# Patient Record
Sex: Male | Born: 1995 | Race: White | Hispanic: No | Marital: Married | Smoking: Current some day smoker
Health system: Southern US, Community
[De-identification: ages and names within clinical notes are randomized; demographics above are authoritative.]

## PROBLEM LIST (undated history)

## (undated) DIAGNOSIS — I10 Essential (primary) hypertension: Secondary | ICD-10-CM

## (undated) DIAGNOSIS — S060X9A Concussion with loss of consciousness of unspecified duration, initial encounter: Secondary | ICD-10-CM

## (undated) DIAGNOSIS — S060XAA Concussion with loss of consciousness status unknown, initial encounter: Secondary | ICD-10-CM

## (undated) DIAGNOSIS — F8081 Childhood onset fluency disorder: Secondary | ICD-10-CM

## (undated) HISTORY — PX: TOE SURGERY: SHX1073

## (undated) HISTORY — DX: Essential (primary) hypertension: I10

---

## 2019-05-06 ENCOUNTER — Emergency Department (HOSPITAL_COMMUNITY): Payer: BC Managed Care – PPO

## 2019-05-06 ENCOUNTER — Encounter (HOSPITAL_COMMUNITY): Payer: Self-pay

## 2019-05-06 ENCOUNTER — Other Ambulatory Visit: Payer: Self-pay

## 2019-05-06 ENCOUNTER — Emergency Department (HOSPITAL_COMMUNITY)
Admission: EM | Admit: 2019-05-06 | Discharge: 2019-05-07 | Disposition: A | Discharge status: Home / Self Care | Payer: BC Managed Care – PPO | Attending: Emergency Medicine | Admitting: Emergency Medicine

## 2019-05-06 DIAGNOSIS — F0781 Postconcussional syndrome: Secondary | ICD-10-CM | POA: Diagnosis not present

## 2019-05-06 DIAGNOSIS — Z87820 Personal history of traumatic brain injury: Secondary | ICD-10-CM | POA: Diagnosis not present

## 2019-05-06 DIAGNOSIS — G43409 Hemiplegic migraine, not intractable, without status migrainosus: Secondary | ICD-10-CM | POA: Diagnosis not present

## 2019-05-06 DIAGNOSIS — F17200 Nicotine dependence, unspecified, uncomplicated: Secondary | ICD-10-CM | POA: Diagnosis not present

## 2019-05-06 DIAGNOSIS — R4789 Other speech disturbances: Secondary | ICD-10-CM | POA: Diagnosis present

## 2019-05-06 HISTORY — DX: Concussion with loss of consciousness of unspecified duration, initial encounter: S06.0X9A

## 2019-05-06 HISTORY — DX: Childhood onset fluency disorder: F80.81

## 2019-05-06 HISTORY — DX: Concussion with loss of consciousness status unknown, initial encounter: S06.0XAA

## 2019-05-06 LAB — URINALYSIS, ROUTINE W REFLEX MICROSCOPIC
Bilirubin Urine: NEGATIVE
Glucose, UA: NEGATIVE mg/dL
Ketones, ur: NEGATIVE mg/dL
Leukocytes,Ua: NEGATIVE
Nitrite: NEGATIVE
Protein, ur: NEGATIVE mg/dL
Specific Gravity, Urine: 1.018 (ref 1.005–1.030)
pH: 5 (ref 5.0–8.0)

## 2019-05-06 LAB — COMPREHENSIVE METABOLIC PANEL
ALT: 56 U/L — ABNORMAL HIGH (ref 0–44)
AST: 34 U/L (ref 15–41)
Albumin: 4.2 g/dL (ref 3.5–5.0)
Alkaline Phosphatase: 66 U/L (ref 38–126)
Anion gap: 13 (ref 5–15)
BUN: 7 mg/dL (ref 6–20)
CO2: 21 mmol/L — ABNORMAL LOW (ref 22–32)
Calcium: 9.4 mg/dL (ref 8.9–10.3)
Chloride: 104 mmol/L (ref 98–111)
Creatinine, Ser: 1 mg/dL (ref 0.61–1.24)
GFR calc Af Amer: >60 mL/min (ref >60)
GFR calc non Af Amer: >60 mL/min (ref >60)
Glucose, Bld: 80 mg/dL (ref 70–99)
Potassium: 4 mmol/L (ref 3.5–5.1)
Sodium: 138 mmol/L (ref 135–145)
Total Bilirubin: 0.6 mg/dL (ref 0.3–1.2)
Total Protein: 7.3 g/dL (ref 6.5–8.1)

## 2019-05-06 LAB — CBC WITH DIFFERENTIAL/PLATELET
Abs Immature Granulocytes: 0.04 10*3/uL (ref 0.00–0.07)
Basophils Absolute: 0.1 10*3/uL (ref 0.0–0.1)
Basophils Relative: 1 %
Eosinophils Absolute: 0.1 10*3/uL (ref 0.0–0.5)
Eosinophils Relative: 1 %
HCT: 47.8 % (ref 39.0–52.0)
Hemoglobin: 17 g/dL (ref 13.0–17.0)
Immature Granulocytes: 0 %
Lymphocytes Relative: 27 %
Lymphs Abs: 3 10*3/uL (ref 0.7–4.0)
MCH: 32.1 pg (ref 26.0–34.0)
MCHC: 35.6 g/dL (ref 30.0–36.0)
MCV: 90.4 fL (ref 80.0–100.0)
Monocytes Absolute: 0.6 10*3/uL (ref 0.1–1.0)
Monocytes Relative: 5 %
Neutro Abs: 7.4 10*3/uL (ref 1.7–7.7)
Neutrophils Relative %: 66 %
Platelets: 250 10*3/uL (ref 150–400)
RBC: 5.29 MIL/uL (ref 4.22–5.81)
RDW: 11.9 % (ref 11.5–15.5)
WBC: 11.1 10*3/uL — ABNORMAL HIGH (ref 4.0–10.5)
nRBC: 0 % (ref 0.0–0.2)

## 2019-05-06 LAB — PROTIME-INR
INR: 1.1 (ref 0.8–1.2)
Prothrombin Time: 14.1 seconds (ref 11.4–15.2)

## 2019-05-06 LAB — APTT: aPTT: 31 seconds (ref 24–36)

## 2019-05-06 LAB — TSH: TSH: 1.969 u[IU]/mL (ref 0.350–4.500)

## 2019-05-06 MED ORDER — METOCLOPRAMIDE HCL 5 MG/ML IJ SOLN
10.0000 mg | Freq: Once | INTRAMUSCULAR | Status: AC
  Administered 2019-05-06: 10 mg via INTRAVENOUS
  Filled 2019-05-06: qty 2

## 2019-05-06 MED ORDER — SODIUM CHLORIDE 0.9 % IV BOLUS
1000.0000 mL | Freq: Once | INTRAVENOUS | Status: AC
  Administered 2019-05-06: 1000 mL via INTRAVENOUS

## 2019-05-06 MED ORDER — KETOROLAC TROMETHAMINE 15 MG/ML IJ SOLN
15.0000 mg | Freq: Once | INTRAMUSCULAR | Status: AC
  Administered 2019-05-06: 15 mg via INTRAVENOUS
  Filled 2019-05-06: qty 1

## 2019-05-06 MED ORDER — GADOBUTROL 1 MMOL/ML IV SOLN
10.0000 mL | Freq: Once | INTRAVENOUS | Status: AC | PRN
  Administered 2019-05-06: 10 mL via INTRAVENOUS

## 2019-05-06 MED ORDER — DIPHENHYDRAMINE HCL 50 MG/ML IJ SOLN
25.0000 mg | Freq: Once | INTRAMUSCULAR | Status: AC
  Administered 2019-05-06: 25 mg via INTRAVENOUS
  Filled 2019-05-06: qty 1

## 2019-05-06 NOTE — ED Triage Notes (Signed)
Pt endorses stuttering speech, tremors, left leg numbness since falling and sustaining a head injury 7 months ago. Has been told by drs that these sx are due to concussion but his father had same sx before being diagnosed with MS. Sx have come and gone intermittently. Hypertensive and has no hx.

## 2019-05-06 NOTE — ED Notes (Signed)
Patient transported to MRI 

## 2019-05-06 NOTE — ED Provider Notes (Signed)
[facility] Provider Note   CSN: [id number] Arrival date & time: 05/06/19  1431    History   Chief Complaint Chief Complaint  Patient presents with  . Tremors    HPI Luis AxonWilliam Skinner is a 23 y.o. male.     HPI  Patient is a 23 year old male with only past medical history of mild TBI (concussion) from May 2019, otherwise no known medical problems who presents to the emergency department today for evaluation of an apparent (acute) onset stuttering speech and left sided weakness since around 1030 hrs. this morning.  He is concerned, as he has had several episodes similar to this, since suffering a concussion last year. He is fearful that something more serious than a concussion could be the reason. He says that his father has multiple sclerosis and he is concerned that he could have it too. He denies any new trauma. He denies any vision changes, and reports only a mild headache at this time.     Past Medical History:  Diagnosis Date  . Concussion   . Stuttering     There are no active problems to display for this patient.   History reviewed. No pertinent surgical history.      Home Medications    Prior to Admission medications   Not on File    Family History History reviewed. No pertinent family history.  Social History Social History   Tobacco Use  . Smoking status: Current Every Day Smoker  Substance Use Topics  . Alcohol use: Yes    Comment: occ  . Drug use: Never     Allergies   Banana   Review of Systems Review of Systems  Constitutional: Negative for chills and fever.  HENT: Negative for ear pain and sore throat.   Eyes: Negative for pain and visual disturbance.  Respiratory: Negative for cough and shortness of breath.   Cardiovascular: Negative for chest pain and palpitations.  Gastrointestinal: Negative for abdominal pain and vomiting.  Genitourinary: Negative for dysuria and hematuria.   Musculoskeletal: Negative for arthralgias and back pain.  Skin: Negative for color change and rash.  Neurological: Positive for weakness (reported left arm and left leg), numbness (left arm and left leg) and headaches. Negative for seizures and syncope.  All other systems reviewed and are negative.    Physical Exam Updated Vital Signs BP 119/65 (BP Location: Left Arm)   Pulse 99   Temp 99.2 F (37.3 C)   Resp 16   SpO2 98%   Physical Exam Vitals signs and nursing note reviewed.  Constitutional:      Appearance: He is well-developed. He is obese.  HENT:     Head: Normocephalic and atraumatic.     Right Ear: External ear normal.     Left Ear: External ear normal.     Nose: Nose normal.     Mouth/Throat:     Mouth: Mucous membranes are moist.  Eyes:     Extraocular Movements: Extraocular movements intact.     Conjunctiva/sclera: Conjunctivae normal.     Pupils: Pupils are equal, round, and reactive to light.  Neck:     Musculoskeletal: Normal range of motion and neck supple. No neck rigidity or muscular tenderness.  Cardiovascular:     Rate and Rhythm: Normal rate and regular rhythm.     Pulses: Normal pulses.     Heart sounds: No murmur.  Pulmonary:     Effort: Pulmonary effort is normal. No respiratory distress.  Breath sounds: Normal breath sounds.  Abdominal:     General: Abdomen is protuberant. Bowel sounds are normal.     Palpations: Abdomen is soft. There is no shifting dullness or fluid wave.     Tenderness: There is no abdominal tenderness.  Musculoskeletal: Normal range of motion.  Lymphadenopathy:     Cervical: No cervical adenopathy.  Skin:    General: Skin is warm and dry.  Neurological:     Mental Status: He is alert and oriented to person, place, and time.     GCS: GCS eye subscore is 4. GCS verbal subscore is 5. GCS motor subscore is 6.     Cranial Nerves: Cranial nerves are intact.     Comments:  Cranial Nerves:  II: Intact to confrontation.   III, IV, VI:  Pupils equal, round and reactive to light . Full eye movements without nystagmus  V, VII: Facial sensation intact bilaterally. No weakness of masticatory muscles and smile is symmetric VIII Auditory Acuity: Grossly normal  IX/X: The uvula is midline; the palate elevates symmetrically  XI: shoulder shrug symmetric  XII: The tongue protrudes midline.    Motor System: Muscle Strength: 5/5 and symmetric in the upper and lower extremities. He does move the LUE and LLE much more slowly than the right, but ultimately has symmetric strength. No pronation or drift of BUE. Muscle Tone: Tone and muscle bulk are symmetric in the upper and lower extremities.   Reflexes: DTRs: 2+ and symmetrical in all four extremities.  Coordination:  Intact finger-to-nose and heel-to-shin. No tremor.  Sensation: Intact to light touch, though does have decreased sensation to the left upper extremity and left lower extremity as compared to the right Gait: ambulates independently without difficulty or notable abnormality      ED Treatments / Results  Labs (all labs ordered are listed, but only abnormal results are displayed) Labs Reviewed  COMPREHENSIVE METABOLIC PANEL - Abnormal; Notable for the following components:      Result Value   CO2 21 (*)    ALT 56 (*)    All other components within normal limits  CBC WITH DIFFERENTIAL/PLATELET - Abnormal; Notable for the following components:   WBC 11.1 (*)    All other components within normal limits  URINALYSIS, ROUTINE W REFLEX MICROSCOPIC - Abnormal; Notable for the following components:   Hgb urine dipstick SMALL (*)    Bacteria, UA RARE (*)    All other components within normal limits  APTT  PROTIME-INR  TSH    EKG None  Radiology Mr Laqueta JeanBrain W And Wo Contrast  Result Date: 05/06/2019 CLINICAL DATA:  Stuttering speech, tremors, and left leg numbness since sustaining head injury 7 months ago. Namely history of multiple sclerosis. EXAM: MRI  HEAD WITHOUT AND WITH CONTRAST TECHNIQUE: Multiplanar, multiecho pulse sequences of the brain and surrounding structures were obtained without and with intravenous contrast. CONTRAST:  10 mL Gadavist COMPARISON:  None. FINDINGS: Brain: No acute infarct, hemorrhage, or mass lesion is present. The ventricles are of normal size. No significant white matter lesions are present. No significant extraaxial fluid collection is present. No focal susceptibility abnormalities are present correspond with prior trauma. Dose callosum is within normal limits. The internal auditory canals are within normal limits. The brainstem and cerebellum are within normal limits. The postcontrast images demonstrate no pathologic enhancement Vascular: Flow is present in the major intracranial arteries. Skull and upper cervical spine: The craniocervical junction is normal. Upper cervical spine is within normal limits. Marrow  signal is unremarkable. Sinuses/Orbits: The paranasal sinuses and mastoid air cells are clear. The globes and orbits are within normal limits. IMPRESSION: 1. Normal MRI of the brain without and with contrast. No sequela of prior trauma or evidence for demyelinating disease. Electronically Signed   By: San Morelle M.D.   On: 05/06/2019 22:27   Mr Cervical Spine W Or Wo Contrast  Result Date: 05/06/2019 CLINICAL DATA:  Acute left-sided weakness and numbness with objective motor weakness in the left upper extremity and left lower extremity is we will sensory change. Symptoms began at 10:30 a.m. today. EXAM: MRI CERVICAL SPINE WITHOUT AND WITH CONTRAST TECHNIQUE: Multiplanar and multiecho pulse sequences of the cervical spine, to include the craniocervical junction and cervicothoracic junction, were obtained without and with intravenous contrast. CONTRAST:  10 mL Gadavist COMPARISON:  MRI brain of the same day. FINDINGS: Alignment: AP alignment is anatomic. Vertebrae: Marrow signal and vertebral body heights are  normal. Cord: Normal signal is present in the cervical and upper thoracic spinal cord to the lowest imaged level, T1-2. Posterior Fossa, vertebral arteries, paraspinal tissues: Craniocervical junction is normal. Flow is present in the vertebral arteries bilaterally. Visualized intracranial contents are normal. Disc levels: There is slight disc desiccation bulging at C5-6 without significant stenosis. No other significant disc disease or stenosis is present in the cervical spine. IMPRESSION: 1. Slight desiccation of the C5-6 cervical disc without significant disc protrusion or stenosis. 2. No acute or focal lesion to explain the patient is left-sided numbness or weakness. Electronically Signed   By: San Morelle M.D.   On: 05/06/2019 22:08    Procedures Procedures (including critical care time)  Medications Ordered in ED Medications  gadobutrol (GADAVIST) 1 MMOL/ML injection 10 mL (10 mLs Intravenous Contrast Given 05/06/19 2157)  sodium chloride 0.9 % bolus 1,000 mL (0 mLs Intravenous Stopped 05/07/19 0137)  ketorolac (TORADOL) 15 MG/ML injection 15 mg (15 mg Intravenous Given 05/06/19 2311)  metoCLOPramide (REGLAN) injection 10 mg (10 mg Intravenous Given 05/06/19 2311)  diphenhydrAMINE (BENADRYL) injection 25 mg (25 mg Intravenous Given 05/06/19 2310)     Initial Impression / Assessment and Plan / ED Course  I have reviewed the triage vital signs and the nursing notes.  Pertinent labs & imaging results that were available during my care of the patient were reviewed by me and considered in my medical decision making (see chart for details).  Of note, this patient was evaluated in the Emergency Department for the symptoms described in the history of present illness. He was evaluated in the context of the global COVID-19 pandemic, which necessitated consideration that the patient might be at risk for infection with the SARS-CoV-2 virus that causes COVID-19. Institutional protocols and  algorithms that pertain to the evaluation of patients at risk for COVID-19 are in a state of rapid change based on information released by regulatory bodies including the CDC and federal and state organizations. These policies and algorithms were followed during the patient's care in the ED.  During this patient encounter, the patient was wearing a mask, and throughout this encounter I was wearing at least a surgical mask.  I was not within 6 feet of this patient for more than 15 minutes without eye protection when they were not wearing mask.   Differentials considered: TIA, CVA, space-occupying lesion, hemiplegic migraine, cluster headache, tension headache, complex migraine with deficit  EM Physician interpretation of Labs & Imaging: . CBC with slightly increased WBC of 11.1, otherwise unremarkable . Metabolic panel  with mildly decreased CO2 of 21 . UA with small Hgb and bacteria  Medical Decision Making:  Luis Skinner is a 23 y.o. male without significant past medical history who is obese and who presents to the emergency department today for evaluation of slow to acute onset left hemiplegia.  This is not the patient's first episode similar to this, he said the last episode was about 5 months ago, and all of the symptoms ultimately resolved on their own after several days. He denies any seizure history. No recent infection. He does not currently have a PCP.  He arrived afebrile and hemodynamically stable.  Neuro examination as above, significant for left hemibody paresis.  I discussed Mr. Mimbs's case with the on-call neurologist who recommended skipping the CT head and going straight to obtaining a MRI.  If the MRIs are normal, the neurologist recommends treating with a migraine cocktail for a complex migraine and reassess.  With symptom control the patient can follow-up in an outpatient clinic with neurology.  The patient was updated and is in agreement with this plan.  Imaging and lab work  has been reviewed.  MRI of the brain with and without contrast has been reviewed and shows no evidence of acute intracranial abnormality.  MRI of the cervical spine has also been reviewed and shows some desiccation of the bony vertebral body at the region of C5-C6.  See the MRI report for further detail, but no significant canal stenosis or disc protrusion.Marland Kitchen.  He was given 1 L of IV fluids, Reglan, Benadryl, Toradol and reassessed, his stuttering of speech had completely resolved and patient was able to stand and ambulate around the emergency department without difficulty, resolution of the patient's presenting symptoms.  At this time I believe no further emergent labs or imaging are indicated and the patient to be safe for discharge to home. They are in agreement with this plan, and are comfortable with discharge to home at this time. I discussed with them concerning signs and symptoms that would necessitate return to the emergency department.  They voiced understanding of these instructions and had no further questions.  The plan for this patient was discussed with my attending physician, Dr. Eber HongBrian Miller, who voiced agreement and who oversaw evaluation and treatment of this patient.   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 Postconcussion syndrome      Disposition: Discharge  Key discharge instructions: Strict return precautions provided. Patient was encouraged to return to the ED should they experience worsening or persistence of current symptoms, or should they develop new concerning symptoms. Encouraged them to f/u with their PCP on an outpatient basis. Questions regarding the diagnosis were answered, and side effects regarding therapies were provided in writing or orally. Patient discharged in stable condition.   Quaneshia Wareing A. Mayford KnifeWilliams, MD Resident Physician, PGY-3 Emergency Medicine [facility] [employer]    Saverio DankerWilliams, Zacharie Portner A, MD  05/07/19 [id number]    Eber HongMiller, Brian, MD 05/07/19 [phone]

## 2019-05-06 NOTE — ED Provider Notes (Signed)
I saw and evaluated the patient, reviewed the resident's note and I agree with the findings and plan.  Pertinent History: This patient is a obese 23 year old male, he has a history of having a concussion back in May, he also has a history of stuttering when he speaks today.  Pertinent Exam findings: On exam the patient has symmetrical strength of all 4 extremities though he moves the left side very slowly.  He has no facial droop but does have a stuttering speech.  He has normal strength at the shoulders girdle to internal/external rotation and abduction, normal strength at the biceps triceps and grips hip flexors and extensors knee flexors and extensors and the ankle flexors and extensors.  His sensation is decreased in the left arm and leg symmetrically compared to the right side which is normal.  He has normal reflexes at the brachioradialis and patellar tendons.  He is able to perform finger-nose-finger.  At this time the patient will need to undergo MRI for further evaluation of both his head and the cervical spine though I have a feeling that this is more supratentorial.  The patient is otherwise well-appearing with no reason to have ischemic or hemorrhagic causes of his illness, this does also not fit a pattern of multiple sclerosis, Guyon Barr, myasthenia gravis or other neuromuscular disorders.  I was personally present and directly supervised the following procedures:  Medical evaluation  ED ECG REPORT  I personally interpreted this EKG   Date: 05/07/2019   Rate: 68  Rhythm: normal sinus rhythm  QRS Axis: normal  Intervals: normal  ST/T Wave abnormalities: normal  Conduction Disutrbances:none  Narrative Interpretation:   Old EKG Reviewed: none available   I personally interpreted the EKG as well as the resident and agree with the interpretation on the resident's chart.  Final diagnoses:  Postconcussion syndrome  Hemiplegic migraine without status migrainosus, not intractable       Noemi Chapel, MD 05/07/19 1348

## 2019-05-10 NOTE — Progress Notes (Signed)
NEUROLOGY CONSULTATION NOTE  Luis AxonWilliam Zacharia MRN: [id number] DOB: Jan 14, 1996  Referring provider: Eber HongBrian Miller, MD Primary care provider: No PCP  Reason for consult:  Hemiplegic migraine, history of concussion  HISTORY OF PRESENT ILLNESS: Luis Skinner is a 23 year old man who presents for episodes of weakness since concussion in May 2019.  History supplemented by ED and prior neurology notes.  He sustained a concussion on 02/20/18 when he fell while getting out of the shower, striking the back of his head.  He did not lose consciousness.  He developed severe headaches.  immediately, he also exhibited stuttering and difficulty with left leg (feels weak, paresthesias, foot turned outward).  He was seen in the ED where CT of head and neck showed no acute abnormalities.  Following this, he exhibited postconcussion symptoms such as daily headaches with photophobia, daytime fatigue, difficulty concentrating and most prominently, stuttering speech.  He followed up with neurologist.  Stuttering speech reportedly improved but then worsened once he went back to work.  He then started experiencing blurred vision in his right eye and felt that his left leg was "turning outward".  No headaches.  His father has multiple sclerosis and was concerned that he too had it.  MRI of brain with and without contrast was performed on 04/29/18, which was normal.    Symptoms slowly resolved after a month.  However, his foot never completely turned straight.  Since then, he would have recurrent acute episodes of left arm and leg pain and weakness, 5-6 over past year and would last 1 to 2 days.  He presented to the ED on 05/06/19 for another episode of gradual onset of stuttering speech and left sided numbness and weakness.  He went to the ED because symptoms more severe and again associated with the stuttering.  He also had associated pain and tenderness in back of head where he hit his head, as well as dull right  periorbital ache and blurred vision.  He was evaluated by the ED provider who endorsed decreased sensation in left arm and leg and noted to move left sided "very slowly".  He was again concerned that he may have multiple sclerosis.  MRI of brain and cervical spine were performed, personally reviewed, and were normal except for slight desiccation of the C5-6 disc.  He was subsequently diagnosed with hemiplegic migraine.  Since then, stuttering improved but left arm and leg is unchanged.    He denies history of headaches or migraines. No family history of migraines.    PAST MEDICAL HISTORY: Past Medical History:  Diagnosis Date  . Concussion   . Stuttering     PAST SURGICAL HISTORY: No past surgical history on file.  MEDICATIONS: No outpatient encounter medications on file as of 05/11/2019.   No facility-administered encounter medications on file as of 05/11/2019.     ALLERGIES: Allergies  Allergen Reactions  . Banana Anaphylaxis    FAMILY HISTORY: Father:  Multiple sclerosis  SOCIAL HISTORY: Social History   Socioeconomic History  . Marital status: Married    Spouse name: Not on file  . Number of children: Not on file  . Years of education: Not on file  . Highest education level: Not on file  Occupational History  . Not on file  Social Needs  . Financial resource strain: Not on file  . Food insecurity    Worry: Not on file    Inability: Not on file  . Transportation needs    Medical: Not on  file    Non-medical: Not on file  Tobacco Use  . Smoking status: Current Every Day Smoker  Substance and Sexual Activity  . Alcohol use: Yes    Comment: occ  . Drug use: Never  . Sexual activity: Not on file  Lifestyle  . Physical activity    Days per week: Not on file    Minutes per session: Not on file  . Stress: Not on file  Relationships  . Social Musicianconnections    Talks on phone: Not on file    Gets together: Not on file    Attends religious service: Not on file     Active member of club or organization: Not on file    Attends meetings of clubs or organizations: Not on file    Relationship status: Not on file  . Intimate partner violence    Fear of current or ex partner: Not on file    Emotionally abused: Not on file    Physically abused: Not on file    Forced sexual activity: Not on file  Other Topics Concern  . Not on file  Social History Narrative  . Not on file    REVIEW OF SYSTEMS: Constitutional: No fevers, chills, or sweats, no generalized fatigue, change in appetite Eyes: No visual changes, double vision, eye pain Ear, nose and throat: No hearing loss, ear pain, nasal congestion, sore throat Cardiovascular: No chest pain, palpitations Respiratory:  No shortness of breath at rest or with exertion, wheezes GastrointestinaI: No nausea, vomiting, diarrhea, abdominal pain, fecal incontinence Genitourinary:  No dysuria, urinary retention or frequency Musculoskeletal:  No neck pain, back pain Integumentary: No rash, pruritus, skin lesions Neurological: as above Psychiatric: No depression, insomnia, anxiety Endocrine: No palpitations, fatigue, diaphoresis, mood swings, change in appetite, change in weight, increased thirst Hematologic/Lymphatic:  No purpura, petechiae. Allergic/Immunologic: no itchy/runny eyes, nasal congestion, recent allergic reactions, rashes  PHYSICAL EXAM: Blood pressure 137/83, pulse 72, temperature 98.3 F (36.8 C), temperature source Oral, height 6\' 2"  (1.88 m), weight (!) 333 lb (151 kg), SpO2 98 %. General: No acute distress.  Patient appears well-groomed.   Head:  Normocephalic/atraumatic Eyes:  fundi examined but not visualized Neck: supple, no paraspinal tenderness, full range of motion Back: No paraspinal tenderness Heart: regular rate and rhythm Lungs: Clear to auscultation bilaterally. Vascular: No carotid bruits. Neurological Exam: Mental status: alert and oriented to person, place, and time, recent and  remote memory intact, fund of knowledge intact, attention and concentration intact, speech fluent and not dysarthric, language intact. Cranial nerves: CN I: not tested CN II: pupils equal, round and reactive to light, visual fields intact CN III, IV, VI:  full range of motion, no nystagmus, no ptosis CN V: facial sensation intact CN VII: upper and lower face symmetric CN VIII: hearing intact CN IX, X: gag intact, uvula midline CN XI: sternocleidomastoid and trapezius muscles intact CN XII: tongue midline Bulk & Tone: normal, no fasciculations. Motor:  Exhibits external rotation of left foot.  Exam reveals decreased effort testing strength of left upper and lower extremities.  On finger thumb tapping, there is decreased speed and amplitude on the left by arrhythmic.  Positive Hoover's sign. Sensation:  Pinprick and vibration sensation reduced on left upper and lower extremities.. Deep Tendon Reflexes:  2+ throughout, toes downgoing.   Finger to nose testing:  Without dysmetria.   Heel to shin:  Without dysmetria.   Gait:  Ambulates with limp, left foot externally rotated.  Able to turn.  Difficulty with tandem walk.  Romberg grossly negative but has trouble placing feet together.  IMPRESSION: Left sided pain/numbness/weakness and blurred vision in right eye.  ED diagnosed hemiplegic migraine, but I strongly suspect functional etiology.  He has had chronic left sided symptoms even in between these episodes in setting of negative MRI.  Symptoms began immediately after hitting his head.  He does reports a dull headache around his right eye but nothing significant.  He has no prior history of migraines.  While people may have persistent migraine aura, his exam findings are suggestive of a functional etiology.  PLAN: 1.  I would like him to be evaluated by ophthalmology given his continued blurred vision in his right eye. 2.  I recommended physical therapy but he defers at this time.  He will  contact us if he changes his mind.  Thank you for allowing me to take part in the care of this patient.  Metta Clines, DO

## 2019-05-11 ENCOUNTER — Encounter: Payer: Self-pay | Admitting: Neurology

## 2019-05-11 ENCOUNTER — Other Ambulatory Visit: Payer: Self-pay

## 2019-05-11 ENCOUNTER — Ambulatory Visit (INDEPENDENT_AMBULATORY_CARE_PROVIDER_SITE_OTHER): Payer: BC Managed Care – PPO | Admitting: Neurology

## 2019-05-11 VITALS — BP 137/83 | HR 72 | Temp 98.3°F | Ht 74.0 in | Wt 333.0 lb

## 2019-05-11 DIAGNOSIS — R531 Weakness: Secondary | ICD-10-CM

## 2019-05-11 DIAGNOSIS — H538 Other visual disturbances: Secondary | ICD-10-CM | POA: Diagnosis not present

## 2019-05-11 NOTE — Patient Instructions (Addendum)
For the blurred vision, I will refer you to an eye doctor for evaluation If you wish to pursue physical therapy, please contact me  You have indicated you will make an appointment with [facility] in [place].

## 2019-06-08 ENCOUNTER — Ambulatory Visit: Payer: BC Managed Care – PPO | Admitting: Neurology

## 2021-04-24 ENCOUNTER — Ambulatory Visit
Admission: RE | Admit: 2021-04-24 | Discharge: 2021-04-24 | Disposition: A | Discharge status: Home / Self Care | Payer: BC Managed Care – PPO | Source: Ambulatory Visit | Attending: Obstetrics and Gynecology | Admitting: Obstetrics and Gynecology

## 2021-04-24 ENCOUNTER — Other Ambulatory Visit: Payer: Self-pay | Admitting: Obstetrics and Gynecology

## 2021-04-24 ENCOUNTER — Other Ambulatory Visit: Payer: Self-pay

## 2021-04-24 DIAGNOSIS — R5381 Other malaise: Secondary | ICD-10-CM

## 2021-04-24 DIAGNOSIS — I951 Orthostatic hypotension: Secondary | ICD-10-CM

## 2021-04-24 DIAGNOSIS — R079 Chest pain, unspecified: Secondary | ICD-10-CM | POA: Diagnosis not present

## 2021-04-24 DIAGNOSIS — J019 Acute sinusitis, unspecified: Secondary | ICD-10-CM

## 2021-04-24 DIAGNOSIS — U071 COVID-19: Secondary | ICD-10-CM

## 2021-04-24 DIAGNOSIS — I10 Essential (primary) hypertension: Secondary | ICD-10-CM

## 2021-04-24 DIAGNOSIS — R030 Elevated blood-pressure reading, without diagnosis of hypertension: Secondary | ICD-10-CM | POA: Diagnosis not present

## 2021-05-01 NOTE — Progress Notes (Signed)
Date:  05/02/2021   ID:  [name], DOB 17-Nov-1995, MRN [id number]  PCP:  Patient, No Pcp Per (Inactive)  Cardiologist:  Rex Kras, DO, [facility]  (established care 05/02/2021)  REASON FOR CONSULT: Hypertension  REQUESTING PHYSICIAN:  Geannie Risen, MD [place],   [id number]  Chief Complaint  Patient presents with   Hypertension   New Patient (Initial Visit)    HPI  Luis Skinner is a 26 y.o. male who presents to the office with a chief complaint of " hypertension." Patient's past medical history and cardiovascular risk factors include: Hypertension, seasonal allergies, obesity due to excess calories, history of COVID-19 infection.  He is referred to the office at the request of Geannie Risen, MD at [facility] for evaluation of hypertension.  Last week at work patient states that he started experiencing generalized weakness, cold, unable to focus, lightheaded and dizzy.  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  Patient was started on antihypertensive medications given his high blood pressure and EKG was performed which was interpreted to be abnormal and is now referred to cardiology for further evaluation and management.  Clinically, at times has chest pain, located substernally, more pronounced with laying down, not brought on by effort related activities, does not resolve with rest, self-limited, a dull-like sensation, lasting for few minutes, last occurrence was late last week.  The pain is nonpleuritic and nonpositional.  And he does not experience any discomfort between the shoulder blades.  Patient states that he has not noticed any change with overall functional capacity.  No family history of premature coronary disease or sudden cardiac death.  FUNCTIONAL STATUS: No structured exercise program  or daily routine.  ALLERGIES: Allergies  Allergen Reactions   Banana Anaphylaxis    MEDICATION LIST PRIOR TO VISIT: Current Meds  Medication Sig   hydrochlorothiazide (MICROZIDE) 12.5 MG capsule Take 12.5 mg by mouth daily.   levocetirizine (XYZAL) 5 MG tablet Take 5 mg by mouth daily.   lisinopril (ZESTRIL) 10 MG tablet Take 10 mg by mouth daily.   metoprolol tartrate (LOPRESSOR) 25 MG tablet Take 1 tablet (25 mg total) by mouth 2 (two) times daily.   montelukast (SINGULAIR) 10 MG tablet Take 10 mg by mouth daily.     PAST MEDICAL HISTORY: Past Medical History:  Diagnosis Date   Concussion    Hypertension    Stuttering     PAST SURGICAL HISTORY: Past Surgical History:  Procedure Laterality Date   TOE SURGERY Right     FAMILY HISTORY: The patient family history includes Depression in his sister; Hypertension in his mother; Multiple sclerosis in his father.  SOCIAL HISTORY:  The patient  reports that he has been smoking cigarettes. He has been smoking an average of .25 packs per day. He has never used smokeless tobacco. He reports current alcohol use of about 2.0 standard drinks of alcohol per week. He reports that he does not use drugs.  REVIEW OF SYSTEMS: Review of Systems  Constitutional: Negative for chills and fever.  HENT:  Negative for hoarse voice and nosebleeds.   Eyes:  Negative for discharge, double vision and pain.  Cardiovascular:  Positive for chest pain. Negative for claudication, dyspnea on exertion, leg swelling, near-syncope, orthopnea, palpitations, paroxysmal nocturnal dyspnea and syncope.  Respiratory:  Negative for hemoptysis and shortness of breath.  Musculoskeletal:  Negative for muscle cramps and myalgias.  Gastrointestinal:  Negative for abdominal pain, constipation, diarrhea, hematemesis, hematochezia, melena, nausea and vomiting.  Neurological:  Negative for dizziness and light-headedness.   PHYSICAL EXAM: Vitals with BMI 05/02/2021  05/11/2019 05/07/2019  Height '6\' 1"'  '6\' 2"'  -  Weight 327 lbs 13 oz 333 lbs -  BMI 69.45 03.88 -  Systolic 828 003 -  Diastolic 85 83 -  Pulse 90 72 99    CONSTITUTIONAL: Obese, appears older than stated age, hemodynamically stable, no acute distress.  SKIN: Skin is warm and dry. No rash noted. No cyanosis. No pallor. No jaundice HEAD: Normocephalic and atraumatic.  EYES: No scleral icterus MOUTH/THROAT: Moist oral membranes.  NECK: No JVD present. No thyromegaly noted. No carotid bruits  LYMPHATIC: No visible cervical adenopathy.  CHEST Normal respiratory effort. No intercostal retractions  LUNGS: Clear to auscultation bilaterally.  No stridor. No wheezes. No rales.  CARDIOVASCULAR: Regular, positive S1-S2, no murmurs rubs or gallops appreciated. ABDOMINAL: Obese, soft, nontender, nondistended, positive bowel sounds in all 4 quadrants, no apparent ascites.  EXTREMITIES: No peripheral edema 2+ dorsalis pedis and posterior tibial pulses. HEMATOLOGIC: No significant bruising NEUROLOGIC: Oriented to person, place, and time. Nonfocal. Normal muscle tone.  PSYCHIATRIC: Normal mood and affect. Normal behavior. Cooperative  CARDIAC DATABASE: EKG: 05/02/2021: Normal sinus rhythm, 88 bpm, right axis deviation, nonspecific T wave abnormality.  No prior ECGs available for review.    Echocardiogram: No results found for this or any previous visit from the past 1095 days.   Stress Testing: No results found for this or any previous visit from the past 1095 days.  Heart Catheterization: None  LABORATORY DATA: CBC Latest Ref Rng & Units 05/06/2019  WBC 4.0 - 10.5 K/uL 11.1(H)  Hemoglobin 13.0 - 17.0 g/dL 17.0  Hematocrit 39.0 - 52.0 % 47.8  Platelets 150 - 400 K/uL 250    CMP Latest Ref Rng & Units 05/06/2019  Glucose 70 - 99 mg/dL 80  BUN 6 - 20 mg/dL 7  Creatinine 0.61 - 1.24 mg/dL 1.00  Sodium 135 - 145 mmol/L 138  Potassium 3.5 - 5.1 mmol/L 4.0  Chloride 98 - 111 mmol/L 104  CO2 22  - 32 mmol/L 21(L)  Calcium 8.9 - 10.3 mg/dL 9.4  Total Protein 6.5 - 8.1 g/dL 7.3  Total Bilirubin 0.3 - 1.2 mg/dL 0.6  Alkaline Phos 38 - 126 U/L 66  AST 15 - 41 U/L 34  ALT 0 - 44 U/L 56(H)    Lipid Panel  No results found for: CHOL, TRIG, HDL, CHOLHDL, VLDL, LDLCALC, LDLDIRECT, LABVLDL  No components found for: NTPROBNP No results for input(s): PROBNP in the last 8760 hours. No results for input(s): TSH in the last 8760 hours.  BMP No results for input(s): NA, K, CL, CO2, GLUCOSE, BUN, CREATININE, CALCIUM, GFRNONAA, GFRAA in the last 8760 hours.  HEMOGLOBIN A1C No results found for: HGBA1C, MPG  External Labs:  Date Collected: 04/25/2021 , information obtained by referring provider Potassium: 4.4 Creatinine 1.02 mg/dL. eGFR: 105 mL/min per 1.73 m Hemoglobin: 16.5 g/dL and hematocrit: 51.6 % AST: 39 , ALT: 64 , alkaline phosphatase: 72   IMPRESSION:    ICD-10-CM   1. Primary hypertension  I10 EKG 12-Lead    PCV ECHOCARDIOGRAM COMPLETE    2. Precordial pain  R07.2     3. Smoking  F17.200     4. Chest pain, unspecified type  R07.9 PCV ECHOCARDIOGRAM COMPLETE    CT CORONARY  MORPH W/CTA COR W/SCORE W/CA W/CM &/OR WO/CM    metoprolol tartrate (LOPRESSOR) 25 MG tablet    5. Class 3 severe obesity due to excess calories without serious comorbidity with body mass index (BMI) of 40.0 to 44.9 in adult [facility])  E66.01    Z68.41        RECOMMENDATIONS: Luis Skinner is a 25 y.o. male whose past medical history and cardiac risk factors include: Hypertension, seasonal allergies, obesity due to excess calories, history of COVID-19 infection.  Blood pressures are improving since last week.  He just started his antihypertensive medications earlier this week.  Therefore recommended being on these medications the next couple weeks and to reevaluate his blood pressures.  Have asked him to invest in a blood pressure cuff and to check his blood pressures twice a day and to bring in  the log either to myself or his provider at work to see if additional medication is warranted.  Patient is educated on the importance of a low-salt diet.  Increasing physical activity as tolerated for now.  Patient states that he is supposed to follow-up with his occupational health next week to have repeat blood work and blood pressure check.  He does not have a PCP at this time and I recommended  he have additional blood work given his metabolic syndrome and to be screened for diabetes and hyperlipidemia.  Patient states that he will request these labs to be done and will bring it in at the next office visit.  Given his precordial discomfort though appears to be noncardiac he has multiple cardiovascular risk factors such as metabolic syndrome, smoking cigarettes/vaping, EKG findings as noted above.  Recommend echocardiogram to evaluate for structural heart disease and a coronary CTA plus or minus CT FFR.  Start Lopressor 25 mg p.o. twice daily -given her upcoming coronary CTA scheduled.  FINAL MEDICATION LIST END OF ENCOUNTER: Meds ordered this encounter  Medications   metoprolol tartrate (LOPRESSOR) 25 MG tablet    Sig: Take 1 tablet (25 mg total) by mouth 2 (two) times daily.    Dispense:  60 tablet    Refill:  0     There are no discontinued medications.   Current Outpatient Medications:    hydrochlorothiazide (MICROZIDE) 12.5 MG capsule, Take 12.5 mg by mouth daily., Disp: , Rfl:    levocetirizine (XYZAL) 5 MG tablet, Take 5 mg by mouth daily., Disp: , Rfl:    lisinopril (ZESTRIL) 10 MG tablet, Take 10 mg by mouth daily., Disp: , Rfl:    metoprolol tartrate (LOPRESSOR) 25 MG tablet, Take 1 tablet (25 mg total) by mouth 2 (two) times daily., Disp: 60 tablet, Rfl: 0   montelukast (SINGULAIR) 10 MG tablet, Take 10 mg by mouth daily., Disp: , Rfl:   Orders Placed This Encounter  Procedures   CT CORONARY MORPH W/CTA COR W/SCORE W/CA W/CM &/OR WO/CM   EKG 12-Lead   PCV ECHOCARDIOGRAM  COMPLETE    There are no Patient Instructions on file for this visit.   --Continue cardiac medications as reconciled in final medication list. --Return in about 4 weeks (around 05/30/2021) for Follow up, BP, Chest pain. Or sooner if needed. --Continue follow-up with your primary care physician regarding the management of your other chronic comorbid conditions.  Patient's questions and concerns were addressed to his satisfaction. He voices understanding of the instructions provided during this encounter.   This note was created using a voice recognition software as a result there may be grammatical  errors inadvertently enclosed that do not reflect the nature of this encounter. Every attempt is made to correct such errors.  Rex Kras, [place], [facility]  Pager: [phone] Office: [phone]

## 2021-05-02 ENCOUNTER — Other Ambulatory Visit: Payer: Self-pay

## 2021-05-02 ENCOUNTER — Ambulatory Visit: Payer: BC Managed Care – PPO | Admitting: Cardiology

## 2021-05-02 ENCOUNTER — Encounter: Payer: Self-pay | Admitting: Cardiology

## 2021-05-02 VITALS — BP 132/85 | HR 90 | Temp 97.3°F | Resp 12 | Ht 73.0 in | Wt 327.8 lb

## 2021-05-02 DIAGNOSIS — F17200 Nicotine dependence, unspecified, uncomplicated: Secondary | ICD-10-CM

## 2021-05-02 DIAGNOSIS — I10 Essential (primary) hypertension: Secondary | ICD-10-CM

## 2021-05-02 DIAGNOSIS — R079 Chest pain, unspecified: Secondary | ICD-10-CM

## 2021-05-02 DIAGNOSIS — R072 Precordial pain: Secondary | ICD-10-CM

## 2021-05-02 DIAGNOSIS — F17210 Nicotine dependence, cigarettes, uncomplicated: Secondary | ICD-10-CM | POA: Diagnosis not present

## 2021-05-02 DIAGNOSIS — Z6841 Body Mass Index (BMI) 40.0 and over, adult: Secondary | ICD-10-CM

## 2021-05-02 MED ORDER — METOPROLOL TARTRATE 25 MG PO TABS: 25.0000 mg | ORAL_TABLET | Freq: Two times a day (BID) | ORAL | 0 refills | Status: DC

## 2021-05-07 ENCOUNTER — Other Ambulatory Visit: Payer: Self-pay

## 2021-05-07 ENCOUNTER — Ambulatory Visit: Payer: BC Managed Care – PPO

## 2021-05-07 DIAGNOSIS — I10 Essential (primary) hypertension: Secondary | ICD-10-CM

## 2021-05-07 DIAGNOSIS — R079 Chest pain, unspecified: Secondary | ICD-10-CM

## 2021-05-17 ENCOUNTER — Telehealth (HOSPITAL_COMMUNITY): Payer: Self-pay | Admitting: Emergency Medicine

## 2021-05-17 NOTE — Progress Notes (Signed)
Called pt to inform him about his echo results. Pt understood Pt also mention he would like his lab results.

## 2021-05-17 NOTE — Telephone Encounter (Signed)
Reaching out to patient to offer assistance regarding upcoming cardiac imaging study; pt verbalizes understanding of appt date/time, parking situation and where to check in, pre-test NPO status and medications ordered, and verified current allergies; name and call back number provided for further questions should they arise Rockwell Alexandria RN Navigator Cardiac Imaging [facility] [phone] office [phone] cell  50mg  metoprolol tartate  Denies IV issues Denies claustro

## 2021-05-20 ENCOUNTER — Other Ambulatory Visit: Payer: Self-pay

## 2021-05-20 ENCOUNTER — Ambulatory Visit (HOSPITAL_COMMUNITY)
Admission: RE | Admit: 2021-05-20 | Discharge: 2021-05-20 | Disposition: A | Discharge status: Home / Self Care | Payer: BC Managed Care – PPO | Source: Ambulatory Visit | Attending: Cardiology | Admitting: Cardiology

## 2021-05-20 DIAGNOSIS — K760 Fatty (change of) liver, not elsewhere classified: Secondary | ICD-10-CM | POA: Insufficient documentation

## 2021-05-20 DIAGNOSIS — R079 Chest pain, unspecified: Secondary | ICD-10-CM | POA: Insufficient documentation

## 2021-05-20 DIAGNOSIS — I10 Essential (primary) hypertension: Secondary | ICD-10-CM | POA: Diagnosis not present

## 2021-05-20 DIAGNOSIS — R072 Precordial pain: Secondary | ICD-10-CM | POA: Diagnosis not present

## 2021-05-20 MED ORDER — NITROGLYCERIN 0.4 MG SL SUBL
0.8000 mg | SUBLINGUAL_TABLET | Freq: Once | SUBLINGUAL | Status: AC
  Administered 2021-05-20: 0.8 mg via SUBLINGUAL

## 2021-05-20 MED ORDER — METOPROLOL TARTRATE 5 MG/5ML IV SOLN: 10.0000 mg | INTRAVENOUS | Status: DC | PRN

## 2021-05-20 MED ORDER — IOHEXOL 350 MG/ML SOLN
80.0000 mL | Freq: Once | INTRAVENOUS | Status: AC | PRN
  Administered 2021-05-20: 80 mL via INTRAVENOUS

## 2021-05-20 MED ORDER — NITROGLYCERIN 0.4 MG SL SUBL
SUBLINGUAL_TABLET | SUBLINGUAL | Status: AC
  Filled 2021-05-20: qty 2

## 2021-05-30 ENCOUNTER — Ambulatory Visit: Payer: BC Managed Care – PPO | Admitting: Cardiology

## 2021-05-30 ENCOUNTER — Other Ambulatory Visit: Payer: Self-pay

## 2021-05-30 ENCOUNTER — Encounter: Payer: Self-pay | Admitting: Cardiology

## 2021-05-30 VITALS — BP 124/77 | HR 69 | Temp 98.7°F | Ht 73.0 in | Wt 325.0 lb

## 2021-05-30 DIAGNOSIS — F17290 Nicotine dependence, other tobacco product, uncomplicated: Secondary | ICD-10-CM

## 2021-05-30 DIAGNOSIS — Z6841 Body Mass Index (BMI) 40.0 and over, adult: Secondary | ICD-10-CM | POA: Diagnosis not present

## 2021-05-30 DIAGNOSIS — I10 Essential (primary) hypertension: Secondary | ICD-10-CM | POA: Diagnosis not present

## 2021-05-30 MED ORDER — LISINOPRIL 20 MG PO TABS: 20.0000 mg | ORAL_TABLET | Freq: Every morning | ORAL | 0 refills | Status: DC

## 2021-05-30 NOTE — Progress Notes (Signed)
Date:  05/30/2021   ID:  Luis Skinner, DOB 10-28-1995, MRN [id number]  PCP:  Geannie Risen, MD  Cardiologist:  Rex Kras, DO, [facility]  (established care 05/02/2021)  Date: 05/30/21 Last Office Visit: 05/02/2021  Chief Complaint  Patient presents with   Hypertension   Chest Pain   Follow-up    HPI  Luis Skinner is a 25 y.o. male who presents to the office with a chief complaint of " follow-up for blood pressure management and reevaluation of chest pain and discuss test results." Patient's past medical history and cardiovascular risk factors include: Hypertension, seasonal allergies, obesity due to excess calories, history of COVID-19 infection.  He is referred to the office at the request of [facility] for evaluation of hypertension.  Prior to establishing care patient was diagnosed with high blood pressure and started on antihypertensive medications by his medical provider at [facility].  At last office visit he was also complaining of symptoms of chest pain and given his young age, nonspecific findings, and multiple risk factors that shared decision was to proceed with an echocardiogram and coronary CTA.  Results of the echocardiogram and coronary CTA discussed with the patient at great length and findings noted below for further reference.  Since last office visit patient has not had any reoccurrence of chest pain.  He is trying to get in at least 4000 steps on a daily basis.  He has stopped smoking cigarettes but continues to vape with decreasing nicotine cartridges.  Patient states that his home blood pressures on current medical regimen is SBP 970-263 mmHg and diastolic blood pressures ranging between 70-80 mmHg.  He has lost 2 pounds since last office visit.  FUNCTIONAL STATUS: No structured exercise program or daily routine.  ALLERGIES: Allergies  Allergen Reactions   Banana Anaphylaxis    MEDICATION LIST PRIOR TO  VISIT: Current Meds  Medication Sig   albuterol (VENTOLIN HFA) 108 (90 Base) MCG/ACT inhaler Inhale 1-2 puffs into the lungs every 4 (four) hours as needed.   hydrochlorothiazide (MICROZIDE) 12.5 MG capsule Take 12.5 mg by mouth daily.   levocetirizine (XYZAL) 5 MG tablet Take 5 mg by mouth daily.   montelukast (SINGULAIR) 10 MG tablet Take 10 mg by mouth daily.   [DISCONTINUED] lisinopril (ZESTRIL) 10 MG tablet Take 10 mg by mouth daily.   [DISCONTINUED] metoprolol tartrate (LOPRESSOR) 25 MG tablet Take 1 tablet (25 mg total) by mouth 2 (two) times daily.     PAST MEDICAL HISTORY: Past Medical History:  Diagnosis Date   Concussion    Hypertension    Stuttering     PAST SURGICAL HISTORY: Past Surgical History:  Procedure Laterality Date   TOE SURGERY Right     FAMILY HISTORY: The patient family history includes Depression in his sister; Hypertension in his mother; Multiple sclerosis in his father.  SOCIAL HISTORY:  The patient  reports that he has been smoking cigarettes. He has been smoking an average of .25 packs per day. He has never used smokeless tobacco. He reports current alcohol use of about 2.0 standard drinks per week. He reports that he does not use drugs.  REVIEW OF SYSTEMS: Review of Systems  Constitutional: Negative for chills and fever.  HENT:  Negative for hoarse voice and nosebleeds.   Eyes:  Negative for discharge, double vision and pain.  Cardiovascular:  Positive for chest pain. Negative for claudication, dyspnea on exertion, leg swelling, near-syncope, orthopnea, palpitations, paroxysmal nocturnal dyspnea and syncope.  Respiratory:  Negative for hemoptysis and shortness of breath.   Musculoskeletal:  Negative for muscle cramps and myalgias.  Gastrointestinal:  Negative for abdominal pain, constipation, diarrhea, hematemesis, hematochezia, melena, nausea and vomiting.  Neurological:  Negative for dizziness and light-headedness.   PHYSICAL EXAM: Vitals  with BMI 05/30/2021 05/20/2021 05/02/2021  Height '6\' 1"'  - '6\' 1"'   Weight 325 lbs - 327 lbs 13 oz  BMI 40.08 - 67.61  Systolic 950 932 671  Diastolic 77 82 85  Pulse 69 - 90    CONSTITUTIONAL: Obese, appears older than stated age, hemodynamically stable, no acute distress.  SKIN: Skin is warm and dry. No rash noted. No cyanosis. No pallor. No jaundice HEAD: Normocephalic and atraumatic.  EYES: No scleral icterus MOUTH/THROAT: Moist oral membranes.  NECK: No JVD present. No thyromegaly noted. No carotid bruits  LYMPHATIC: No visible cervical adenopathy.  CHEST Normal respiratory effort. No intercostal retractions  LUNGS: Clear to auscultation bilaterally.  No stridor. No wheezes. No rales.  CARDIOVASCULAR: Regular, positive S1-S2, no murmurs rubs or gallops appreciated. ABDOMINAL: Obese, soft, nontender, nondistended, positive bowel sounds in all 4 quadrants, no apparent ascites.  EXTREMITIES: No peripheral edema 2+ dorsalis pedis and posterior tibial pulses. HEMATOLOGIC: No significant bruising NEUROLOGIC: Oriented to person, place, and time. Nonfocal. Normal muscle tone.  PSYCHIATRIC: Normal mood and affect. Normal behavior. Cooperative  CARDIAC DATABASE: EKG: 05/02/2021: Normal sinus rhythm, 88 bpm, right axis deviation, nonspecific T wave abnormality.  No prior ECGs available for review.    Echocardiogram: 05/07/2021: Normal LV systolic function with visual EF 50-55%. Left ventricle cavity is normal in size. Mild to moderate left ventricular hypertrophy. Normal global wall motion. Normal diastolic filling pattern, normal LAP. Mild (Grade I) mitral regurgitation. Mild tricuspid regurgitation. RVSP measures 31 mmHg. IVC is dilated with a respiratory response of <50%.   Stress Testing: No results found for this or any previous visit from the past 1095 days.  Heart Catheterization: None  CCTA:  05/20/2021: 1. Total coronary calcium score of 0. 2. Normal coronary origin with right  dominance. 3. CAD-RADS = 0 No evidence of epicardial coronary artery disease.  LABORATORY DATA: CBC Latest Ref Rng & Units 05/06/2019  WBC 4.0 - 10.5 K/uL 11.1(H)  Hemoglobin 13.0 - 17.0 g/dL 17.0  Hematocrit 39.0 - 52.0 % 47.8  Platelets 150 - 400 K/uL 250    CMP Latest Ref Rng & Units 05/06/2019  Glucose 70 - 99 mg/dL 80  BUN 6 - 20 mg/dL 7  Creatinine 0.61 - 1.24 mg/dL 1.00  Sodium 135 - 145 mmol/L 138  Potassium 3.5 - 5.1 mmol/L 4.0  Chloride 98 - 111 mmol/L 104  CO2 22 - 32 mmol/L 21(L)  Calcium 8.9 - 10.3 mg/dL 9.4  Total Protein 6.5 - 8.1 g/dL 7.3  Total Bilirubin 0.3 - 1.2 mg/dL 0.6  Alkaline Phos 38 - 126 U/L 66  AST 15 - 41 U/L 34  ALT 0 - 44 U/L 56(H)    Lipid Panel  No results found for: CHOL, TRIG, HDL, CHOLHDL, VLDL, LDLCALC, LDLDIRECT, LABVLDL  No components found for: NTPROBNP No results for input(s): PROBNP in the last 8760 hours. No results for input(s): TSH in the last 8760 hours.  BMP No results for input(s): NA, K, CL, CO2, GLUCOSE, BUN, CREATININE, CALCIUM, GFRNONAA, GFRAA in the last 8760 hours.  HEMOGLOBIN A1C No results found for: HGBA1C, MPG  External Labs:  Date Collected: 04/25/2021 , information obtained by referring provider Potassium: 4.4 Creatinine 1.02 mg/dL.  eGFR: 105 mL/min per 1.73 m Hemoglobin: 16.5 g/dL and hematocrit: 51.6 % AST: 39 , ALT: 64 , alkaline phosphatase: 72   External Labs: Collected: 05/06/2021 Sodium 135, potassium 4.6, chloride 95, bicarb 21, BUN 9, creatinine 0.91 Total cholesterol 200, triglycerides 185, HDL 27, LDL 139 Hemoglobin A1c 5.5 TSH 1.29 Magnesium 2.2 BNP 7.2  IMPRESSION:    ICD-10-CM   1. Primary hypertension  I10 lisinopril (ZESTRIL) 20 MG tablet    Basic metabolic panel    Magnesium    2. 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 Class 3 severe obesity due to excess calories without serious comorbidity with body mass index (BMI) of 40.0 to 44.9 in adult [facility])  E66.01     Z68.41        RECOMMENDATIONS: Cobin Cadavid is a 25 y.o. male whose past medical history and cardiac risk factors include: Hypertension, seasonal allergies, obesity due to excess calories, history of COVID-19 infection.  Primary hypertension Home blood pressures are very well controlled. Discontinue Lopressor Increase lisinopril to 20 mg p.o. daily.  Blood work in 1 week to evaluate kidney function and electrolytes. I have encouraged the patient to continue with lifestyle changes with regards to increasing physical activity to 30 minutes a day 5 days a week, low-salt diet, goal for 10,000 steps per day.  Am very hopeful that if he resolves the metabolic syndrome he may require minimal to no antihypertensive medications. He will need follow-up for long-term management. I will hold off on checking secondary causes of hypertension and renal duplex at this time as his blood pressures are well controlled on very minimal medical therapy.  Vaping nicotine dependence, tobacco product Educated on the importance of complete cessation of nicotine products.  Class 3 severe obesity due to excess calories without serious comorbidity with body mass index (BMI) of 40.0 to 44.9 in adult [facility]) Body mass index is 42.88 kg/m. I reviewed with the patient the importance of diet, regular physical activity/exercise, weight loss.   Patient is educated on increasing physical activity gradually as tolerated.  With the goal of moderate intensity exercise for 30 minutes a day 5 days a week.  As part of today's office visit I reviewed the results of the echo and coronary CTA.  LVEF is preserved without any significant valvular heart disease.  Coronary CTA notes a total coronary calcium score of 0 and no evidence of epicardial coronary artery disease.  FINAL MEDICATION LIST END OF ENCOUNTER: Meds ordered this encounter  Medications   lisinopril (ZESTRIL) 20 MG tablet    Sig: Take 1 tablet (20 mg total) by  mouth every morning.    Dispense:  30 tablet    Refill:  0     Medications Discontinued During This Encounter  Medication Reason   metoprolol tartrate (LOPRESSOR) 25 MG tablet Discontinued by provider   lisinopril (ZESTRIL) 10 MG tablet Reorder     Current Outpatient Medications:    albuterol (VENTOLIN HFA) 108 (90 Base) MCG/ACT inhaler, Inhale 1-2 puffs into the lungs every 4 (four) hours as needed., Disp: , Rfl:    hydrochlorothiazide (MICROZIDE) 12.5 MG capsule, Take 12.5 mg by mouth daily., Disp: , Rfl:    levocetirizine (XYZAL) 5 MG tablet, Take 5 mg by mouth daily., Disp: , Rfl:    montelukast (SINGULAIR) 10 MG tablet, Take 10 mg by mouth daily., Disp: , Rfl:    lisinopril (ZESTRIL) 20 MG tablet, Take 1 tablet (20 mg total) by mouth  every morning., Disp: 30 tablet, Rfl: 0  Orders Placed This Encounter  Procedures   Basic metabolic panel   Magnesium    There are no Patient Instructions on file for this visit.   --Continue cardiac medications as reconciled in final medication list. --Return in about 3 months (around 08/30/2021) for Follow up, BP. Or sooner if needed. --Continue follow-up with your primary care physician regarding the management of your other chronic comorbid conditions.  Patient's questions and concerns were addressed to his satisfaction. He voices understanding of the instructions provided during this encounter.   This note was created using a voice recognition software as a result there may be grammatical errors inadvertently enclosed that do not reflect the nature of this encounter. Every attempt is made to correct such errors.  Rex Kras, [place], [facility]  Pager: [phone] Office: [phone]

## 2021-06-07 ENCOUNTER — Other Ambulatory Visit: Payer: Self-pay

## 2021-06-07 DIAGNOSIS — I10 Essential (primary) hypertension: Secondary | ICD-10-CM

## 2021-07-10 ENCOUNTER — Other Ambulatory Visit: Payer: Self-pay | Admitting: Cardiology

## 2021-07-10 DIAGNOSIS — I10 Essential (primary) hypertension: Secondary | ICD-10-CM

## 2021-07-15 ENCOUNTER — Other Ambulatory Visit: Payer: Self-pay

## 2021-07-15 DIAGNOSIS — I10 Essential (primary) hypertension: Secondary | ICD-10-CM

## 2021-07-15 MED ORDER — LISINOPRIL 20 MG PO TABS
ORAL_TABLET | ORAL | 1 refills | Status: DC
Start: 2021-07-15 — End: 2022-04-14

## 2021-08-30 ENCOUNTER — Other Ambulatory Visit: Payer: Self-pay

## 2021-08-30 ENCOUNTER — Encounter: Payer: Self-pay | Admitting: Cardiology

## 2021-08-30 ENCOUNTER — Ambulatory Visit: Payer: BC Managed Care – PPO | Admitting: Cardiology

## 2021-08-30 VITALS — BP 130/78 | HR 88 | Resp 16 | Ht 73.0 in | Wt 322.0 lb

## 2021-08-30 DIAGNOSIS — Z6841 Body Mass Index (BMI) 40.0 and over, adult: Secondary | ICD-10-CM

## 2021-08-30 DIAGNOSIS — F17290 Nicotine dependence, other tobacco product, uncomplicated: Secondary | ICD-10-CM

## 2021-08-30 DIAGNOSIS — I10 Essential (primary) hypertension: Secondary | ICD-10-CM

## 2021-08-30 NOTE — Progress Notes (Signed)
Date:  08/30/2021   ID:  Luis Skinner, DOB May 05, 1996, MRN [id number]  PCP:  Geannie Risen, MD  Cardiologist:  Luis Kras, DO, [facility]  (established care 05/02/2021)  Date: 08/30/21 Last Office Visit: 05/30/2021  Chief Complaint  Patient presents with   Hypertension   Follow-up    HPI  Luis Skinner is a 25 y.o. male who presents to the office with a chief complaint of " follow-up for blood pressure management." Patient's past medical history and cardiovascular risk factors include: Hypertension, seasonal allergies, obesity due to excess calories, history of COVID-19 infection.  He is referred to the office at the request of [facility] for evaluation of hypertension.  Referred to the office for evaluation and management of benign essential hypertension.  His medications have been uptitrated and his home blood pressures are now consistently less than 737 mmHg and diastolic blood pressures are less than 82 mmHg.  He is implementing lifestyle changes slowly and has lost approximately 3 pounds over the course of 3 months.  He states that he is been walking 2 miles per day.  During prior office visit he is complained of chest discomfort for which she has undergone ischemic evaluation as outlined below.  Since last office visit he denies any chest pain or shortness of breath at rest or with effort related activities.  His overall functional status and physical endurance remains relatively stable.  FUNCTIONAL STATUS: No structured exercise program or daily routine.  ALLERGIES: Allergies  Allergen Reactions   Banana Anaphylaxis    MEDICATION LIST PRIOR TO VISIT: Current Meds  Medication Sig   albuterol (VENTOLIN HFA) 108 (90 Base) MCG/ACT inhaler Inhale 1-2 puffs into the lungs every 4 (four) hours as needed.   hydrochlorothiazide (MICROZIDE) 12.5 MG capsule Take 12.5 mg by mouth daily.   levocetirizine (XYZAL) 5 MG tablet Take 5 mg by mouth daily.    lisinopril (ZESTRIL) 20 MG tablet TAKE 1 TABLET BY MOUTH EVERY DAY IN THE MORNING   montelukast (SINGULAIR) 10 MG tablet Take 10 mg by mouth daily.     PAST MEDICAL HISTORY: Past Medical History:  Diagnosis Date   Concussion    Hypertension    Stuttering     PAST SURGICAL HISTORY: Past Surgical History:  Procedure Laterality Date   TOE SURGERY Right     FAMILY HISTORY: The patient family history includes Depression in his sister; Hypertension in his mother; Multiple sclerosis in his father.  SOCIAL HISTORY:  The patient  reports that he has been smoking cigarettes. He has been smoking an average of .25 packs per day. He has never used smokeless tobacco. He reports current alcohol use of about 2.0 standard drinks per week. He reports that he does not use drugs.  REVIEW OF SYSTEMS: Review of Systems  Constitutional: Negative for chills and fever.  HENT:  Negative for hoarse voice and nosebleeds.   Eyes:  Negative for discharge, double vision and pain.  Cardiovascular:  Negative for chest pain, claudication, dyspnea on exertion, leg swelling, near-syncope, orthopnea, palpitations, paroxysmal nocturnal dyspnea and syncope.  Respiratory:  Negative for hemoptysis and shortness of breath.   Musculoskeletal:  Negative for muscle cramps and myalgias.  Gastrointestinal:  Negative for abdominal pain, constipation, diarrhea, hematemesis, hematochezia, melena, nausea and vomiting.  Neurological:  Negative for dizziness and light-headedness.   PHYSICAL EXAM: Vitals with BMI 08/30/2021 05/30/2021 05/20/2021  Height 6' 1" 6' 1" -  Weight 322 lbs 325 lbs -  BMI 42.49 42.89 -  Systolic 408 144 818  Diastolic 78 77 82  Pulse 88 69 -    CONSTITUTIONAL: Obese, appears older than stated age, hemodynamically stable, no acute distress.  SKIN: Skin is warm and dry. No rash noted. No cyanosis. No pallor. No jaundice HEAD: Normocephalic and atraumatic.  EYES: No scleral icterus MOUTH/THROAT: Moist  oral membranes.  NECK: No JVD present. No thyromegaly noted. No carotid bruits  LYMPHATIC: No visible cervical adenopathy.  CHEST Normal respiratory effort. No intercostal retractions  LUNGS: Clear to auscultation bilaterally.  No stridor. No wheezes. No rales.  CARDIOVASCULAR: Regular, positive S1-S2, no murmurs rubs or gallops appreciated. ABDOMINAL: Obese, soft, nontender, nondistended, positive bowel sounds in all 4 quadrants, no apparent ascites.  EXTREMITIES: No peripheral edema 2+ dorsalis pedis and posterior tibial pulses. HEMATOLOGIC: No significant bruising NEUROLOGIC: Oriented to person, place, and time. Nonfocal. Normal muscle tone.  PSYCHIATRIC: Normal mood and affect. Normal behavior. Cooperative  CARDIAC DATABASE: EKG: 05/02/2021: Normal sinus rhythm, 88 bpm, right axis deviation, nonspecific T wave abnormality.  No prior ECGs available for review.    Echocardiogram: 05/07/2021: Normal LV systolic function with visual EF 50-55%. Left ventricle cavity is normal in size. Mild to moderate left ventricular hypertrophy. Normal global wall motion. Normal diastolic filling pattern, normal LAP. Mild (Grade I) mitral regurgitation. Mild tricuspid regurgitation. RVSP measures 31 mmHg. IVC is dilated with a respiratory response of <50%.   Stress Testing: No results found for this or any previous visit from the past 1095 days.  Heart Catheterization: None  CCTA:  05/20/2021: 1. Total coronary calcium score of 0. 2. Normal coronary origin with right dominance. 3. CAD-RADS = 0 No evidence of epicardial coronary artery disease.  LABORATORY DATA: CBC Latest Ref Rng & Units 05/06/2019  WBC 4.0 - 10.5 K/uL 11.1(H)  Hemoglobin 13.0 - 17.0 g/dL 17.0  Hematocrit 39.0 - 52.0 % 47.8  Platelets 150 - 400 K/uL 250    CMP Latest Ref Rng & Units 05/06/2019  Glucose 70 - 99 mg/dL 80  BUN 6 - 20 mg/dL 7  Creatinine 0.61 - 1.24 mg/dL 1.00  Sodium 135 - 145 mmol/L 138  Potassium 3.5 -  5.1 mmol/L 4.0  Chloride 98 - 111 mmol/L 104  CO2 22 - 32 mmol/L 21(L)  Calcium 8.9 - 10.3 mg/dL 9.4  Total Protein 6.5 - 8.1 g/dL 7.3  Total Bilirubin 0.3 - 1.2 mg/dL 0.6  Alkaline Phos 38 - 126 U/L 66  AST 15 - 41 U/L 34  ALT 0 - 44 U/L 56(H)    Lipid Panel  No results found for: CHOL, TRIG, HDL, CHOLHDL, VLDL, LDLCALC, LDLDIRECT, LABVLDL  No components found for: NTPROBNP No results for input(s): PROBNP in the last 8760 hours. No results for input(s): TSH in the last 8760 hours.  BMP No results for input(s): NA, K, CL, CO2, GLUCOSE, BUN, CREATININE, CALCIUM, GFRNONAA, GFRAA in the last 8760 hours.  HEMOGLOBIN A1C No results found for: HGBA1C, MPG  External Labs:  Date Collected: 04/25/2021 , information obtained by referring provider Potassium: 4.4 Creatinine 1.02 mg/dL. eGFR: 105 mL/min per 1.73 m Hemoglobin: 16.5 g/dL and hematocrit: 51.6 % AST: 39 , ALT: 64 , alkaline phosphatase: 72   External Labs: Collected: 05/06/2021 Sodium 135, potassium 4.6, chloride 95, bicarb 21, BUN 9, creatinine 0.91 Total cholesterol 200, triglycerides 185, HDL 27, LDL 139 Hemoglobin A1c 5.5 TSH 1.29 Magnesium 2.2 BNP 7.2  IMPRESSION:    ICD-10-CM   1. Benign hypertension  I10  2. 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 Class 3 severe obesity due to excess calories without serious comorbidity with body mass index (BMI) of 40.0 to 44.9 in adult [facility])  E66.01    Z68.41         RECOMMENDATIONS: Laureano Hetzer is a 25 y.o. male whose past medical history and cardiac risk factors include: Hypertension, seasonal allergies, obesity due to excess calories, history of COVID-19 infection.  Benign essential hypertension Home blood pressures are well controlled. Medications reconciled. Reemphasized the importance of low-salt diet and increasing physical activity with a goal of 30 minutes a day 5 days a week of moderate intensity workout.  Vaping nicotine  dependence, tobacco product Educated on the importance of complete cessation of nicotine products.  Class 3 severe obesity due to excess calories without serious comorbidity with body mass index (BMI) of 40.0 to 44.9 in adult [facility]) Body mass index is 42.48 kg/m. I reviewed with the patient the importance of diet, regular physical activity/exercise, weight loss.   Patient is educated on increasing physical activity gradually as tolerated.  With the goal of moderate intensity exercise for 30 minutes a day 5 days a week.  FINAL MEDICATION LIST END OF ENCOUNTER: No orders of the defined types were placed in this encounter.    There are no discontinued medications.    Current Outpatient Medications:    albuterol (VENTOLIN HFA) 108 (90 Base) MCG/ACT inhaler, Inhale 1-2 puffs into the lungs every 4 (four) hours as needed., Disp: , Rfl:    hydrochlorothiazide (MICROZIDE) 12.5 MG capsule, Take 12.5 mg by mouth daily., Disp: , Rfl:    levocetirizine (XYZAL) 5 MG tablet, Take 5 mg by mouth daily., Disp: , Rfl:    lisinopril (ZESTRIL) 20 MG tablet, TAKE 1 TABLET BY MOUTH EVERY DAY IN THE MORNING, Disp: 90 tablet, Rfl: 1   montelukast (SINGULAIR) 10 MG tablet, Take 10 mg by mouth daily., Disp: , Rfl:   No orders of the defined types were placed in this encounter.   There are no Patient Instructions on file for this visit.   --Continue cardiac medications as reconciled in final medication list. --Return in about 1 year (around 08/30/2022) for Follow up, BP. Or sooner if needed. --Continue follow-up with your primary care physician regarding the management of your other chronic comorbid conditions.  Patient's questions and concerns were addressed to his satisfaction. He voices understanding of the instructions provided during this encounter.   This note was created using a voice recognition software as a result there may be grammatical errors inadvertently enclosed that do not reflect the nature  of this encounter. Every attempt is made to correct such errors.  Luis Skinner, [place], [facility]  Pager: [phone] Office: [phone]

## 2022-02-05 DIAGNOSIS — M545 Low back pain, unspecified: Secondary | ICD-10-CM | POA: Diagnosis not present

## 2022-02-05 DIAGNOSIS — G8929 Other chronic pain: Secondary | ICD-10-CM | POA: Diagnosis not present

## 2022-02-05 DIAGNOSIS — M5432 Sciatica, left side: Secondary | ICD-10-CM | POA: Diagnosis not present

## 2022-02-05 DIAGNOSIS — M5442 Lumbago with sciatica, left side: Secondary | ICD-10-CM | POA: Diagnosis not present

## 2022-02-06 DIAGNOSIS — Z3009 Encounter for other general counseling and advice on contraception: Secondary | ICD-10-CM | POA: Diagnosis not present

## 2022-02-21 ENCOUNTER — Ambulatory Visit: Payer: BC Managed Care – PPO | Admitting: Family Medicine

## 2022-02-25 DIAGNOSIS — M5416 Radiculopathy, lumbar region: Secondary | ICD-10-CM | POA: Diagnosis not present

## 2022-03-21 DIAGNOSIS — M47816 Spondylosis without myelopathy or radiculopathy, lumbar region: Secondary | ICD-10-CM | POA: Diagnosis not present

## 2022-03-21 DIAGNOSIS — Z23 Encounter for immunization: Secondary | ICD-10-CM | POA: Diagnosis not present

## 2022-03-26 DIAGNOSIS — M5116 Intervertebral disc disorders with radiculopathy, lumbar region: Secondary | ICD-10-CM | POA: Diagnosis not present

## 2022-03-26 DIAGNOSIS — M5117 Intervertebral disc disorders with radiculopathy, lumbosacral region: Secondary | ICD-10-CM | POA: Diagnosis not present

## 2022-03-26 DIAGNOSIS — M4807 Spinal stenosis, lumbosacral region: Secondary | ICD-10-CM | POA: Diagnosis not present

## 2022-04-04 DIAGNOSIS — Z302 Encounter for sterilization: Secondary | ICD-10-CM | POA: Diagnosis not present

## 2022-04-13 ENCOUNTER — Other Ambulatory Visit: Payer: Self-pay | Admitting: Cardiology

## 2022-04-13 DIAGNOSIS — I10 Essential (primary) hypertension: Secondary | ICD-10-CM

## 2022-05-05 DIAGNOSIS — M5416 Radiculopathy, lumbar region: Secondary | ICD-10-CM | POA: Diagnosis not present

## 2022-05-05 DIAGNOSIS — M47816 Spondylosis without myelopathy or radiculopathy, lumbar region: Secondary | ICD-10-CM | POA: Diagnosis not present

## 2022-05-23 DIAGNOSIS — M47816 Spondylosis without myelopathy or radiculopathy, lumbar region: Secondary | ICD-10-CM | POA: Diagnosis not present

## 2022-06-02 IMAGING — CT CT HEART MORP W/ CTA COR W/ SCORE W/ CA W/CM &/OR W/O CM
3 of 5 series · 12 of 20 positions shown, 13 images · IV contrast (omnipaque)
Comparison: None.
COMPARISON: None.

Addendum:
EXAM:
OVER-READ INTERPRETATION  CT CHEST

The following report is an over-read performed by radiologist Dr.
Siti Khatijah Mss [[email or web address]] on 05/20/2021. This over-read
does not include interpretation of cardiac or coronary anatomy or
pathology. The coronary CTA interpretation by the cardiologist is
attached.
HISTORY: Chest pain, nonspecific
Cardiac/Coronary  CT
TECHNIQUE: The patient was scanned on a Siemens Force scanner.
PROTOCOL: A 120 kV prospective scan was triggered in the descending thoracic
aorta at 111 HU's. Axial non-contrast 3 mm slices were carried out
through the heart. The data set was analyzed on a dedicated work
station and scored using the Agatson method. Gantry rotation speed
was 250 msecs and collimation was .6 mm. No IV beta blockade but
mg of sl NTG was given. The 3D data set was reconstructed in 5%
intervals of the 67-82 % of the R-R cycle. Diastolic phases were
analyzed on a dedicated work station using MPR, MIP and VRT modes.
The patient received 80mL OMNIPAQUE IOHEXOL 350 MG/ML SOLN of
contrast.

[Series 6: best diast 72 % · axial · 0.42mm/px · z∈[-293,-218]mm · 4 of 313 slices shown]
[im 63/313  vessel]
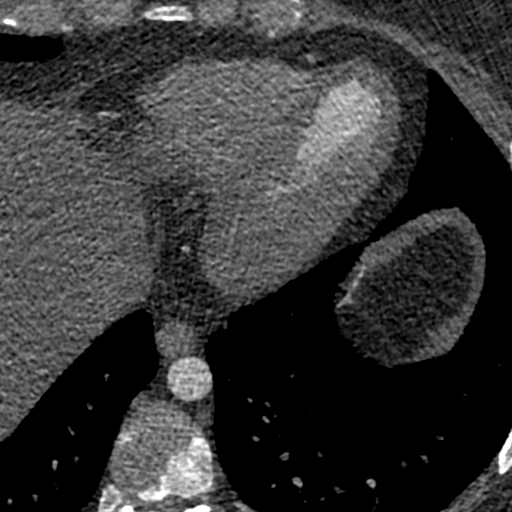
[im 125/313  vessel]
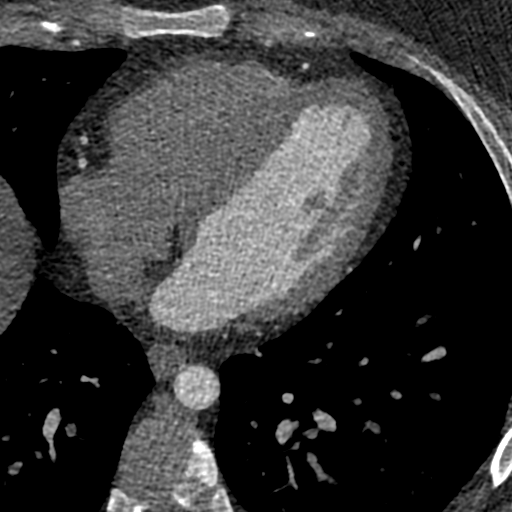
[im 188/313  vessel]
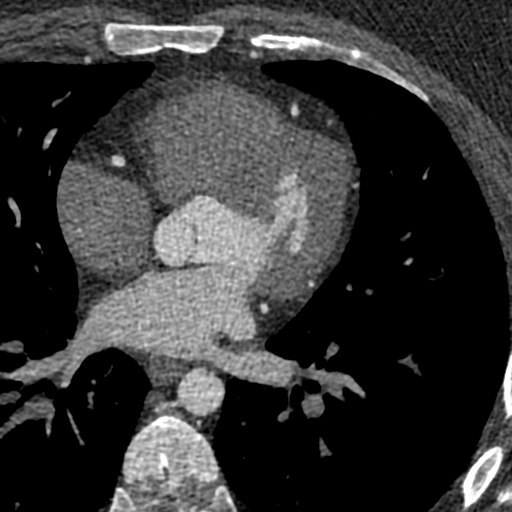
[im 250/313  vessel]
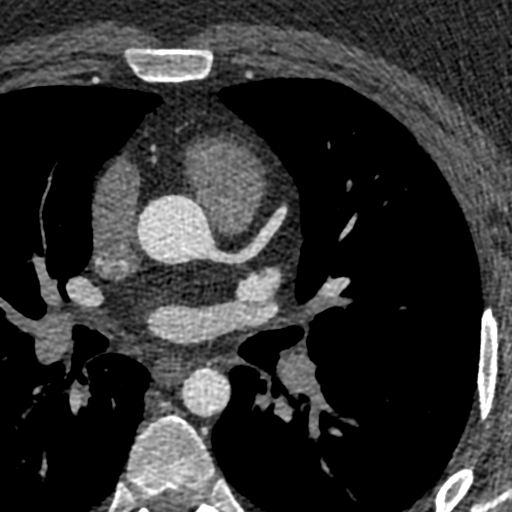

[Series 7: best syst · axial · 0.42mm/px · z∈[-293,-218]mm · 4 of 313 slices shown, 5 images]
[im 63/313  vessel]
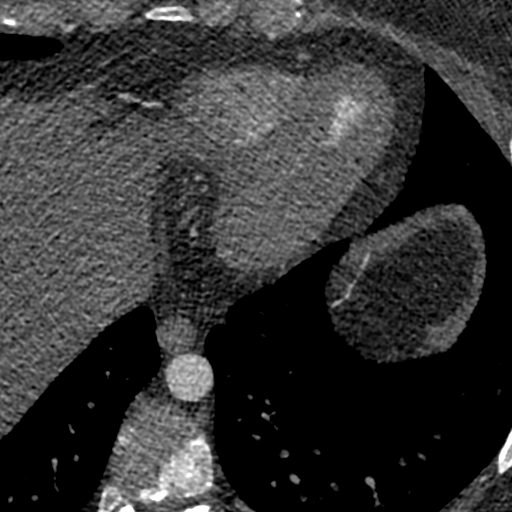
[im 63/313  lung]
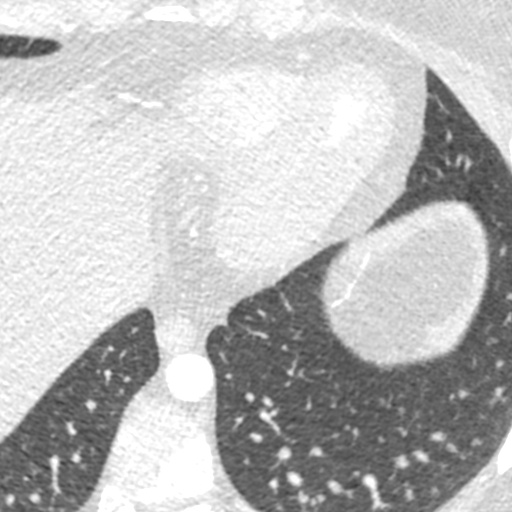
[im 125/313  vessel]
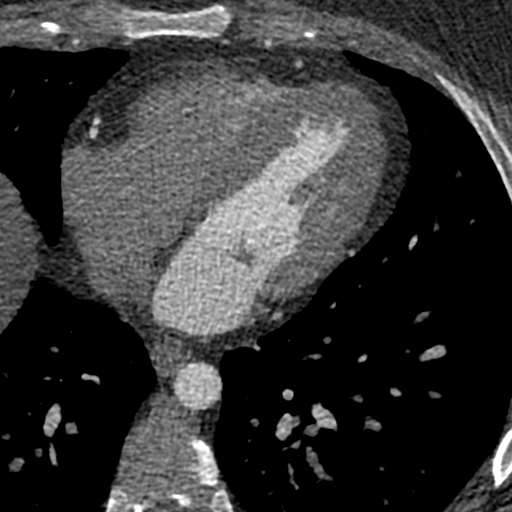
[im 188/313  vessel]
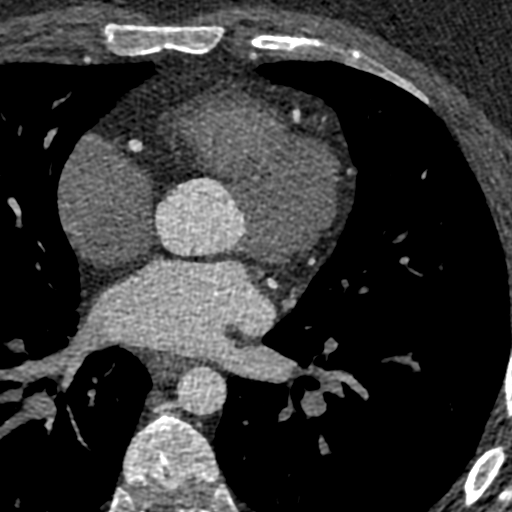
[im 250/313  vessel]
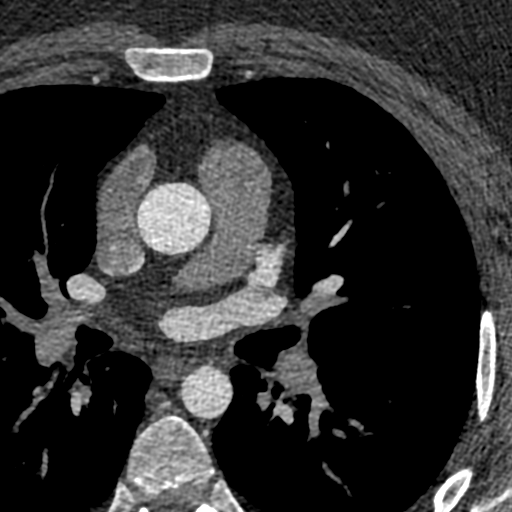

[Series 9: ts syst sharp · axial · 0.42mm/px · z∈[-293,-218]mm · 4 of 313 slices shown]
[im 63/313  lung]
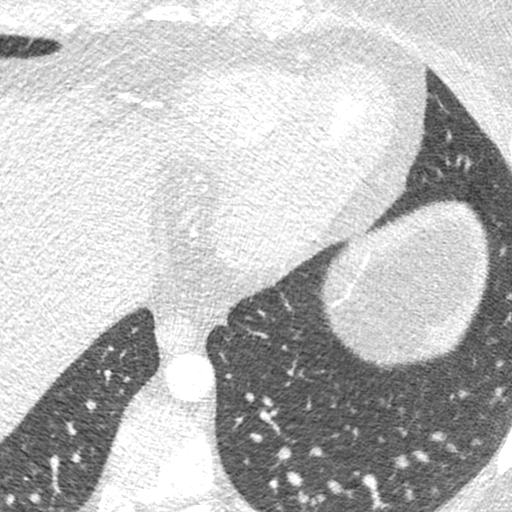
[im 125/313  lung]
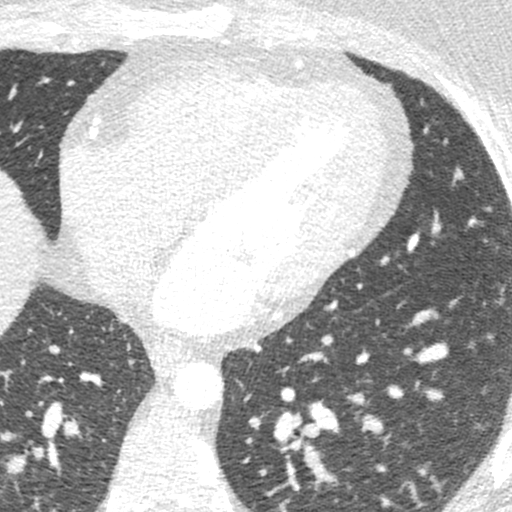
[im 188/313  lung]
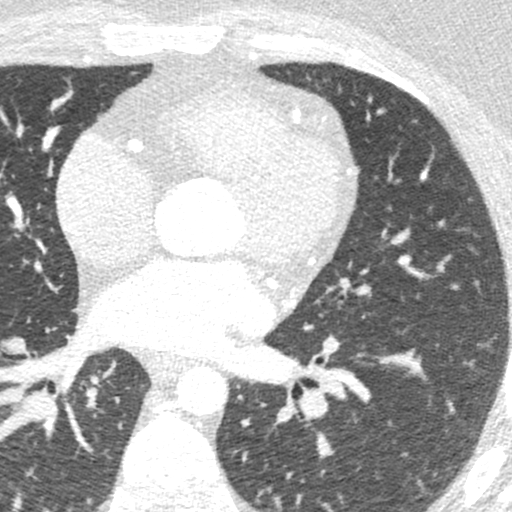
[im 250/313  lung]
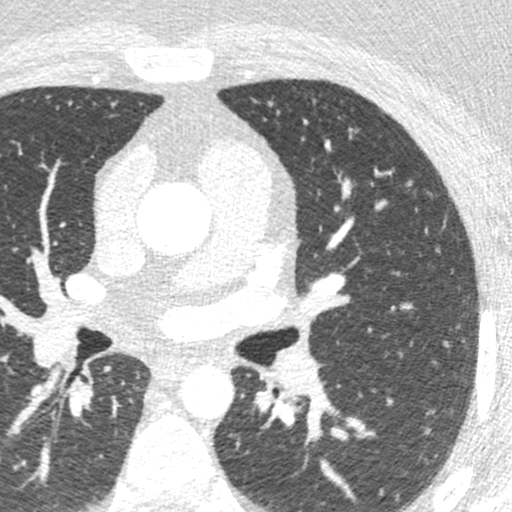

[12 of 20 positions shown; findings below may reference images not displayed]

FINDINGS: Vascular: Heart is normal size.  Visualized aorta normal caliber.

Mediastinum/Nodes: No adenopathy

Lungs/Pleura: No confluent opacities or effusions.

Upper Abdomen: Imaging into the upper abdomen demonstrates no acute
findings. Diffuse low-density throughout the liver likely reflects
fatty infiltration.

Musculoskeletal: Chest wall soft tissues are unremarkable. No acute
bony abnormality.
IMPRESSION: No acute extra cardiac abnormality.

Hepatic steatosis.
FINDINGS: Image quality: Average.

Artifact: Limited.

Total coronary calcium score of 0.

Coronary arteries: Normal coronary origins.  Right dominance.

Left Main Coronary Artery: The left main is a normal caliber vessel
with a normal take off from the left coronary cusp that bifurcates
to form a left anterior descending artery and a left circumflex
artery. There is no plaque or stenosis.

Left Anterior Descending Coronary Artery: Normal caliber vessel,
wraps the apex, gives off 3 patent diagonal branches. The LAD is
patent without evidence of plaque or stenosis.

Left Circumflex Artery: Normal caliber vessel, non-dominant, travels
within the atrioventricular groove, gives off 2 patent obtuse
marginal branches. The LCX is patent with no evidence of plaque or
stenosis.

Right Coronary Artery: The RCA is dominant with normal take off from
the right coronary cusp. The RCA terminates as a PDA and right
posterolateral branch without evidence of plaque or stenosis.

Left Atrium: Grossly normal in size with no left atrial appendage
filling defect.

Left Ventricle: Grossly normal in size. There are no stigmata of
prior infarction. There is no abnormal filling defect.

Pulmonary arteries: Normal in size without proximal filling defect.

Pulmonary veins: Normal pulmonary venous drainage.

Aorta: Normal size, 29.6 mm at the mid ascending aorta (level of the
PA bifurcation) measured double oblique. No calcifications. No
dissection.

Pericardium: Normal thickness with no significant effusion or
calcium present.

Cardiac valves: The aortic valve is trileaflet without
calcification. The mitral valve is normal structure without
calcification.

Extra-cardiac findings: See attached radiology report for
non-cardiac structures.
IMPRESSION: 1. Total coronary calcium score of 0.

2. Normal coronary origin with right dominance.

3. CAD-RADS = 0 No evidence of epicardial coronary artery disease.

RECOMMENDATIONS:

Consider non-atherosclerotic causes of chest pain.

*** End of Addendum ***
EXAM:
OVER-READ INTERPRETATION  CT CHEST

The following report is an over-read performed by radiologist Dr.
Siti Khatijah Mss [[email or web address]] on 05/20/2021. This over-read
does not include interpretation of cardiac or coronary anatomy or
pathology. The coronary CTA interpretation by the cardiologist is
attached.
FINDINGS: Vascular: Heart is normal size.  Visualized aorta normal caliber.

Mediastinum/Nodes: No adenopathy

Lungs/Pleura: No confluent opacities or effusions.

Upper Abdomen: Imaging into the upper abdomen demonstrates no acute
findings. Diffuse low-density throughout the liver likely reflects
fatty infiltration.

Musculoskeletal: Chest wall soft tissues are unremarkable. No acute
bony abnormality.
IMPRESSION: No acute extra cardiac abnormality.

Hepatic steatosis.

## 2022-07-04 DIAGNOSIS — M25552 Pain in left hip: Secondary | ICD-10-CM | POA: Diagnosis not present

## 2022-07-04 DIAGNOSIS — M5416 Radiculopathy, lumbar region: Secondary | ICD-10-CM | POA: Diagnosis not present

## 2022-07-04 DIAGNOSIS — M5386 Other specified dorsopathies, lumbar region: Secondary | ICD-10-CM | POA: Diagnosis not present

## 2022-07-04 DIAGNOSIS — G478 Other sleep disorders: Secondary | ICD-10-CM | POA: Diagnosis not present

## 2022-07-08 DIAGNOSIS — M47816 Spondylosis without myelopathy or radiculopathy, lumbar region: Secondary | ICD-10-CM | POA: Diagnosis not present

## 2022-07-08 DIAGNOSIS — M5416 Radiculopathy, lumbar region: Secondary | ICD-10-CM | POA: Diagnosis not present

## 2022-07-15 DIAGNOSIS — R293 Abnormal posture: Secondary | ICD-10-CM | POA: Diagnosis not present

## 2022-07-15 DIAGNOSIS — M5416 Radiculopathy, lumbar region: Secondary | ICD-10-CM | POA: Diagnosis not present

## 2022-07-15 DIAGNOSIS — M47816 Spondylosis without myelopathy or radiculopathy, lumbar region: Secondary | ICD-10-CM | POA: Diagnosis not present

## 2022-07-15 DIAGNOSIS — R2689 Other abnormalities of gait and mobility: Secondary | ICD-10-CM | POA: Diagnosis not present

## 2022-07-25 DIAGNOSIS — M47816 Spondylosis without myelopathy or radiculopathy, lumbar region: Secondary | ICD-10-CM | POA: Diagnosis not present

## 2022-08-04 DIAGNOSIS — M47816 Spondylosis without myelopathy or radiculopathy, lumbar region: Secondary | ICD-10-CM | POA: Diagnosis not present

## 2022-08-26 DIAGNOSIS — M5386 Other specified dorsopathies, lumbar region: Secondary | ICD-10-CM | POA: Diagnosis not present

## 2022-08-26 DIAGNOSIS — M5116 Intervertebral disc disorders with radiculopathy, lumbar region: Secondary | ICD-10-CM | POA: Diagnosis not present

## 2022-08-26 DIAGNOSIS — R293 Abnormal posture: Secondary | ICD-10-CM | POA: Diagnosis not present

## 2022-08-26 DIAGNOSIS — R6889 Other general symptoms and signs: Secondary | ICD-10-CM | POA: Diagnosis not present

## 2022-08-29 ENCOUNTER — Ambulatory Visit: Payer: BC Managed Care – PPO | Admitting: Cardiology

## 2022-09-15 ENCOUNTER — Ambulatory Visit: Payer: BC Managed Care – PPO | Admitting: Cardiology

## 2022-09-15 NOTE — Progress Notes (Deleted)
Date:  09/15/2022   ID:  Luis Skinner, DOB December 30, 1995, MRN [id number]  PCP:  Geannie Risen, MD  Cardiologist:  Rex Kras, DO, [facility]  (established care 05/02/2021)   No chief complaint on file.   HPI  Luis Skinner is a 26 y.o. male whose past medical history and cardiovascular risk factors include: Hypertension, seasonal allergies, obesity due to excess calories, history of COVID-19 infection.  He was referred to the office at the request of [facility] for evaluation of hypertension.  ***  Pre-Charted  FUNCTIONAL STATUS: No structured exercise program or daily routine.  ALLERGIES: Allergies  Allergen Reactions   Banana Anaphylaxis    MEDICATION LIST PRIOR TO VISIT: No outpatient medications have been marked as taking for the 09/15/22 encounter (Appointment) with Rex Kras, DO.     PAST MEDICAL HISTORY: Past Medical History:  Diagnosis Date   Concussion    Hypertension    Stuttering     PAST SURGICAL HISTORY: Past Surgical History:  Procedure Laterality Date   TOE SURGERY Right     FAMILY HISTORY: The patient family history includes Depression in his sister; Hypertension in his mother; Multiple sclerosis in his father.  SOCIAL HISTORY:  The patient  reports that he has been smoking cigarettes. He has been smoking an average of .25 packs per day. He has never used smokeless tobacco. He reports current alcohol use of about 2.0 standard drinks of alcohol per week. He reports that he does not use drugs.  REVIEW OF SYSTEMS: Review of Systems  Cardiovascular:  Negative for chest pain, claudication, dyspnea on exertion, irregular heartbeat, leg swelling, near-syncope, orthopnea, palpitations, paroxysmal nocturnal dyspnea and syncope.  Respiratory:  Negative for shortness of breath.   Hematologic/Lymphatic: Negative for bleeding problem.  Musculoskeletal:  Negative for muscle cramps and myalgias.  Neurological:  Negative for dizziness  and light-headedness.    PHYSICAL EXAM:    08/30/2021   10:49 AM 05/30/2021    1:29 PM 05/20/2021    4:20 PM  Vitals with BMI  Height 6' 1" 6' 1"   Weight 322 lbs 325 lbs   BMI 76.72 09.47   Systolic 096 283 662  Diastolic 78 77 82  Pulse 88 69     CONSTITUTIONAL: Obese, appears older than stated age, hemodynamically stable, no acute distress.  SKIN: Skin is warm and dry. No rash noted. No cyanosis. No pallor. No jaundice HEAD: Normocephalic and atraumatic.  EYES: No scleral icterus MOUTH/THROAT: Moist oral membranes.  NECK: No JVD present. No thyromegaly noted. No carotid bruits  CHEST Normal respiratory effort. No intercostal retractions  LUNGS: Clear to auscultation bilaterally.  No stridor. No wheezes. No rales.  CARDIOVASCULAR: Regular, positive S1-S2, no murmurs rubs or gallops appreciated. ABDOMINAL: Obese, soft, nontender, nondistended, positive bowel sounds in all 4 quadrants, no apparent ascites.  EXTREMITIES: No peripheral edema 2+ dorsalis pedis and posterior tibial pulses. HEMATOLOGIC: No significant bruising NEUROLOGIC: Oriented to person, place, and time. Nonfocal. Normal muscle tone.  PSYCHIATRIC: Normal mood and affect. Normal behavior. Cooperative  CARDIAC DATABASE: EKG: 05/02/2021: Normal sinus rhythm, 88 bpm, right axis deviation, nonspecific T wave abnormality.  No prior ECGs available for review.   ***  Echocardiogram: 05/07/2021: Normal LV systolic function with visual EF 50-55%. Left ventricle cavity is normal in size. Mild to moderate left ventricular hypertrophy. Normal global wall motion. Normal diastolic filling pattern, normal LAP. Mild (Grade I) mitral regurgitation. Mild tricuspid regurgitation. RVSP measures 31 mmHg. IVC is dilated with a  respiratory response of <50%.   Stress Testing: No results found for this or any previous visit from the past 1095 days.  Heart Catheterization: None  CCTA:  05/20/2021: 1. Total coronary calcium score  of 0. 2. Normal coronary origin with right dominance. 3. CAD-RADS = 0 No evidence of epicardial coronary artery disease.  LABORATORY DATA:    Latest Ref Rng & Units 05/06/2019    2:52 PM  CBC  WBC 4.0 - 10.5 K/uL 11.1   Hemoglobin 13.0 - 17.0 g/dL 17.0   Hematocrit 39.0 - 52.0 % 47.8   Platelets 150 - 400 K/uL 250        Latest Ref Rng & Units 05/06/2019    2:52 PM  CMP  Glucose 70 - 99 mg/dL 80   BUN 6 - 20 mg/dL 7   Creatinine 0.61 - 1.24 mg/dL 1.00   Sodium 135 - 145 mmol/L 138   Potassium 3.5 - 5.1 mmol/L 4.0   Chloride 98 - 111 mmol/L 104   CO2 22 - 32 mmol/L 21   Calcium 8.9 - 10.3 mg/dL 9.4   Total Protein 6.5 - 8.1 g/dL 7.3   Total Bilirubin 0.3 - 1.2 mg/dL 0.6   Alkaline Phos 38 - 126 U/L 66   AST 15 - 41 U/L 34   ALT 0 - 44 U/L 56     Lipid Panel  No results found for: "CHOL", "TRIG", "HDL", "CHOLHDL", "VLDL", "LDLCALC", "LDLDIRECT", "LABVLDL"  No components found for: "NTPROBNP" No results for input(s): "PROBNP" in the last 8760 hours. No results for input(s): "TSH" in the last 8760 hours.  BMP No results for input(s): "NA", "K", "CL", "CO2", "GLUCOSE", "BUN", "CREATININE", "CALCIUM", "GFRNONAA", "GFRAA" in the last 8760 hours.  HEMOGLOBIN A1C No results found for: "HGBA1C", "MPG"  External Labs:  Date Collected: 04/25/2021 , information obtained by referring provider Potassium: 4.4 Creatinine 1.02 mg/dL. eGFR: 105 mL/min per 1.73 m Hemoglobin: 16.5 g/dL and hematocrit: 51.6 % AST: 39 , ALT: 64 , alkaline phosphatase: 72   External Labs: Collected: 05/06/2021 Sodium 135, potassium 4.6, chloride 95, bicarb 21, BUN 9, creatinine 0.91 Total cholesterol 200, triglycerides 185, HDL 27, LDL 139 Hemoglobin A1c 5.5 TSH 1.29 Magnesium 2.2 BNP 7.2  IMPRESSION:  No diagnosis found.     RECOMMENDATIONS: Luis Skinner is a 26 y.o. male whose past medical history and cardiac risk factors include: Hypertension, seasonal allergies, obesity due to  excess calories, history of COVID-19 infection.  ***  FINAL MEDICATION LIST END OF ENCOUNTER: No orders of the defined types were placed in this encounter.    There are no discontinued medications.    Current Outpatient Medications:    albuterol (VENTOLIN HFA) 108 (90 Base) MCG/ACT inhaler, Inhale 1-2 puffs into the lungs every 4 (four) hours as needed., Disp: , Rfl:    hydrochlorothiazide (MICROZIDE) 12.5 MG capsule, Take 12.5 mg by mouth daily., Disp: , Rfl:    levocetirizine (XYZAL) 5 MG tablet, Take 5 mg by mouth daily., Disp: , Rfl:    lisinopril (ZESTRIL) 20 MG tablet, TAKE 1 TABLET BY MOUTH EVERY DAY IN THE MORNING, Disp: 90 tablet, Rfl: 1   montelukast (SINGULAIR) 10 MG tablet, Take 10 mg by mouth daily., Disp: , Rfl:   No orders of the defined types were placed in this encounter.   There are no Patient Instructions on file for this visit.   --Continue cardiac medications as reconciled in final medication list. --No follow-ups on file. Or sooner if needed. --  Continue follow-up with your primary care physician regarding the management of your other chronic comorbid conditions.  Patient's questions and concerns were addressed to his satisfaction. He voices understanding of the instructions provided during this encounter.   This note was created using a voice recognition software as a result there may be grammatical errors inadvertently enclosed that do not reflect the nature of this encounter. Every attempt is made to correct such errors.  Rex Kras, [place], [facility]  Pager: [phone] Office: [phone]

## 2022-10-19 ENCOUNTER — Other Ambulatory Visit: Payer: Self-pay | Admitting: Cardiology

## 2022-10-19 DIAGNOSIS — I10 Essential (primary) hypertension: Secondary | ICD-10-CM
